# Patient Record
Sex: Male | Born: 1960 | Race: Asian | Hispanic: No | Marital: Married | Smoking: Never smoker
Health system: Southern US, Community
[De-identification: ages and names within clinical notes are randomized; demographics above are authoritative.]

## PROBLEM LIST (undated history)

## (undated) DIAGNOSIS — R079 Chest pain, unspecified: Secondary | ICD-10-CM

## (undated) DIAGNOSIS — Z8249 Family history of ischemic heart disease and other diseases of the circulatory system: Secondary | ICD-10-CM

## (undated) DIAGNOSIS — I10 Essential (primary) hypertension: Secondary | ICD-10-CM

## (undated) DIAGNOSIS — I2510 Atherosclerotic heart disease of native coronary artery without angina pectoris: Secondary | ICD-10-CM

## (undated) HISTORY — DX: Family history of ischemic heart disease and other diseases of the circulatory system: Z82.49

## (undated) HISTORY — DX: Essential (primary) hypertension: I10

## (undated) HISTORY — DX: Chest pain, unspecified: R07.9

---

## 2010-09-12 ENCOUNTER — Emergency Department (HOSPITAL_COMMUNITY): Payer: Self-pay

## 2010-09-12 ENCOUNTER — Encounter (HOSPITAL_COMMUNITY): Payer: Self-pay | Admitting: Radiology

## 2010-09-12 ENCOUNTER — Emergency Department (HOSPITAL_COMMUNITY)
Admission: EM | Admit: 2010-09-12 | Discharge: 2010-09-12 | Disposition: A | Discharge status: Home / Self Care | Payer: Self-pay | Attending: Emergency Medicine | Admitting: Emergency Medicine

## 2010-09-12 DIAGNOSIS — R29898 Other symptoms and signs involving the musculoskeletal system: Secondary | ICD-10-CM | POA: Insufficient documentation

## 2010-09-12 DIAGNOSIS — G459 Transient cerebral ischemic attack, unspecified: Secondary | ICD-10-CM | POA: Insufficient documentation

## 2010-09-12 DIAGNOSIS — I10 Essential (primary) hypertension: Secondary | ICD-10-CM | POA: Insufficient documentation

## 2010-09-12 DIAGNOSIS — R51 Headache: Secondary | ICD-10-CM | POA: Insufficient documentation

## 2010-09-12 DIAGNOSIS — I252 Old myocardial infarction: Secondary | ICD-10-CM | POA: Insufficient documentation

## 2010-09-12 DIAGNOSIS — R209 Unspecified disturbances of skin sensation: Secondary | ICD-10-CM | POA: Insufficient documentation

## 2010-09-12 LAB — COMPREHENSIVE METABOLIC PANEL
AST: 22 U/L (ref 0–37)
Albumin: 4 g/dL (ref 3.5–5.2)
BUN: 12 mg/dL (ref 6–23)
Chloride: 104 mEq/L (ref 96–112)
Creatinine, Ser: 0.87 mg/dL (ref 0.50–1.35)
Potassium: 3.8 mEq/L (ref 3.5–5.1)
Total Protein: 6.9 g/dL (ref 6.0–8.3)

## 2010-09-12 LAB — HEMOGLOBIN A1C: Mean Plasma Glucose: 103 mg/dL (ref <117)

## 2010-09-12 LAB — APTT: aPTT: 29 seconds (ref 24–37)

## 2010-09-12 LAB — CK TOTAL AND CKMB (NOT AT ARMC)
CK, MB: 4.7 ng/mL — ABNORMAL HIGH (ref 0.3–4.0)
Relative Index: 1.6 (ref 0.0–2.5)
Total CK: 285 U/L — ABNORMAL HIGH (ref 7–232)

## 2010-09-12 LAB — CBC
MCH: 31.9 pg (ref 26.0–34.0)
MCV: 91.5 fL (ref 78.0–100.0)
Platelets: 148 10*3/uL — ABNORMAL LOW (ref 150–400)
RBC: 4.95 MIL/uL (ref 4.22–5.81)
RDW: 12.1 % (ref 11.5–15.5)
WBC: 7.5 10*3/uL (ref 4.0–10.5)

## 2010-09-12 LAB — PROTIME-INR
INR: 0.94 (ref 0.00–1.49)
Prothrombin Time: 12.8 seconds (ref 11.6–15.2)

## 2010-09-12 LAB — URINALYSIS, ROUTINE W REFLEX MICROSCOPIC
Glucose, UA: NEGATIVE mg/dL
Hgb urine dipstick: NEGATIVE
Leukocytes, UA: NEGATIVE
pH: 6.5 (ref 5.0–8.0)

## 2012-09-26 ENCOUNTER — Encounter: Payer: Self-pay | Admitting: Cardiovascular Disease

## 2012-09-26 ENCOUNTER — Ambulatory Visit (INDEPENDENT_AMBULATORY_CARE_PROVIDER_SITE_OTHER): Payer: BC Managed Care – PPO | Admitting: Cardiovascular Disease

## 2012-09-26 VITALS — BP 130/98 | HR 65 | Ht 67.25 in | Wt 179.0 lb

## 2012-09-26 DIAGNOSIS — Z8249 Family history of ischemic heart disease and other diseases of the circulatory system: Secondary | ICD-10-CM | POA: Insufficient documentation

## 2012-09-26 DIAGNOSIS — I10 Essential (primary) hypertension: Secondary | ICD-10-CM | POA: Insufficient documentation

## 2012-09-26 DIAGNOSIS — R079 Chest pain, unspecified: Secondary | ICD-10-CM | POA: Insufficient documentation

## 2012-09-26 MED ORDER — LISINOPRIL 5 MG PO TABS: 5.0000 mg | ORAL_TABLET | Freq: Every day | ORAL | Status: DC

## 2012-09-26 NOTE — Assessment & Plan Note (Signed)
Patient has a chest pain for several months. It is not necessarily related to exercise but also occurs after eating. He is on a PPI. This was factors include hypertension and strong family history of heart disease with a father and brother both of died of MIs. Under the get any exercise Myoview stress test to risk stratify him.

## 2012-09-26 NOTE — Patient Instructions (Addendum)
  We will see you back in follow up after the myoview  Dr Allyson Sabal has ordered an exercise myoview. Your physician has requested that you have en exercise stress myoview. For further information please visit [email or web address] Please follow instruction sheet, as given.  Start Lisiniopril 5 mg daily.  The prescription has been sent to the pharmacy.

## 2012-09-26 NOTE — Progress Notes (Signed)
   09/26/2012 Steven Mcneil   1960-12-26  [id number]  Primary Physician Daisy Floro, MD Primary Cardiologist: Runell Gess MD Roseanne Reno   HPI:  Mr. Steven Mcneil is a 52 year old married [place] male([place]) father of one 24-year-old daughter who is referred by Dr. Duane Lope for evaluation of chest pain. His cardiac risk factors profile includes include hypertension as well as family history. He had chest pain for several months that is intermittent. It is not necessary cilia associated with exertion.    Current Outpatient Prescriptions  Medication Sig Dispense Refill  . aspirin 81 MG tablet Take 81 mg by mouth daily.      Marland Kitchen omeprazole (PRILOSEC) 20 MG capsule Take 20 mg by mouth daily.      Marland Kitchen lisinopril (PRINIVIL,ZESTRIL) 5 MG tablet Take 1 tablet (5 mg total) by mouth daily.  30 tablet  6   No current facility-administered medications for this visit.    No Known Allergies  History   Social History  . Marital Status: Married    Spouse Name: N/A    Number of Children: N/A  . Years of Education: N/A   Occupational History  . Not on file.   Social History Main Topics  . Smoking status: Never Smoker   . Smokeless tobacco: Not on file  . Alcohol Use: No  . Drug Use: Not on file  . Sexual Activity: Not on file   Other Topics Concern  . Not on file   Social History Narrative  . No narrative on file     Review of Systems: General: negative for chills, fever, night sweats or weight changes.  Cardiovascular: negative for chest pain, dyspnea on exertion, edema, orthopnea, palpitations, paroxysmal nocturnal dyspnea or shortness of breath Dermatological: negative for rash Respiratory: negative for cough or wheezing Urologic: negative for hematuria Abdominal: negative for nausea, vomiting, diarrhea, bright red blood per rectum, melena, or hematemesis Neurologic: negative for visual changes, syncope, or dizziness All other systems reviewed and are otherwise  negative except as noted above.    Blood pressure 130/98, pulse 65, height 5' 7.25" (1.708 m), weight 179 lb (81.194 kg).  General appearance: alert and no distress Neck: no adenopathy, no carotid bruit, no JVD, supple, symmetrical, trachea midline and thyroid not enlarged, symmetric, no tenderness/mass/nodules Lungs: clear to auscultation bilaterally Heart: regular rate and rhythm, S1, S2 normal, no murmur, click, rub or gallop Abdomen: soft, non-tender; bowel sounds normal; no masses,  no organomegaly Extremities: extremities normal, atraumatic, no cyanosis or edema Pulses: 2+ and symmetric  EKG normal sinus rhythm at 65 without ST or T wave changes  ASSESSMENT AND PLAN:   Chest pain Patient has a chest pain for several months. It is not necessarily related to exercise but also occurs after eating. He is on a PPI. This was factors include hypertension and strong family history of heart disease with a father and brother both of died of MIs. Under the get any exercise Myoview stress test to risk stratify him.  Essential hypertension Poorly controlled currently on no medications. His renal function is normal. I'm going to begin him on a low-dose ACE inhibitor.      Runell Gess MD FACP,FACC,FAHA, [facility] 09/26/2012 12:07 PM

## 2012-09-26 NOTE — Assessment & Plan Note (Signed)
Poorly controlled currently on no medications. His renal function is normal. I'm going to begin him on a low-dose ACE inhibitor.

## 2012-10-02 ENCOUNTER — Ambulatory Visit (HOSPITAL_COMMUNITY)
Admission: RE | Admit: 2012-10-02 | Discharge: 2012-10-02 | Disposition: A | Discharge status: Home / Self Care | Payer: BC Managed Care – PPO | Source: Ambulatory Visit | Attending: Cardiovascular Disease | Admitting: Cardiovascular Disease

## 2012-10-02 DIAGNOSIS — R079 Chest pain, unspecified: Secondary | ICD-10-CM | POA: Insufficient documentation

## 2012-10-02 MED ORDER — TECHNETIUM TC 99M SESTAMIBI GENERIC - CARDIOLITE
30.6000 | Freq: Once | INTRAVENOUS | Status: AC | PRN
  Administered 2012-10-02: 31 via INTRAVENOUS

## 2012-10-02 MED ORDER — TECHNETIUM TC 99M SESTAMIBI GENERIC - CARDIOLITE
10.8000 | Freq: Once | INTRAVENOUS | Status: AC | PRN
  Administered 2012-10-02: 11 via INTRAVENOUS

## 2012-10-02 NOTE — Procedures (Addendum)
[facility] [address] [place] [address] [phone]  Cardiology Nuclear Med Study  Steven Mcneil is a 52 y.o. male     MRN : [id number]     DOB: 08-Aug-1960  Procedure Date: 10/02/2012  Nuclear Med Background Indication for Stress Test:  Evaluation for Ischemia History:  No prior cardiac history Cardiac Risk Factors: Family History - CAD and Hypertension  Symptoms:  Chest Pain   Nuclear Pre-Procedure Caffeine/Decaff Intake:  9:00pm NPO After: 7:00am   IV Site: R Antecubital  IV 0.9% NS with Angio Cath:  22g  Chest Size (in):  42 IV Started by: Koren Shiver, CNMT  Height: 5\' 7"  (1.702 m)  Cup Size: n/a  BMI:  Body mass index is 28.03 kg/(m^2). Weight:  179 lb (81.194 kg)   Tech Comments:  n/a    Nuclear Med Study 1 or 2 day study: 1 day  Stress Test Type:  Stress  Order Authorizing Provider:  Nanetta Batty, MD   Resting Radionuclide: Technetium 11m Sestamibi  Resting Radionuclide Dose: 10.8 mCi   Stress Radionuclide:  Technetium 83m Sestamibi  Stress Radionuclide Dose: 30.6 mCi           Stress Protocol Rest HR: 69 Stress HR: 166  Rest BP: 120/91 Stress BP: 164/87  Exercise Time (min): 10:46 METS: 11.20   Predicted Max HR: 170 bpm % Max HR: 97.65 bpm Rate Pressure Product: 21308  Dose of Adenosine (mg):  n/a Dose of Lexiscan: n/a mg  Dose of Atropine (mg): n/a Dose of Dobutamine: n/a mcg/kg/min (at max HR)  Stress Test Technologist: Ernestene Mention, CCT Nuclear Technologist: Gonzella Lex, CNMT   Rest Procedure:  Myocardial perfusion imaging was performed at rest 45 minutes following the intravenous administration of Technetium 46m Sestamibi. Stress Procedure:  The patient performed treadmill exercise using a Bruce  Protocol for 10 minutes and 46 seconds. The patient stopped due to chest pain, shortness and fatigue. Pain scale 1 to 10 was a 2 or 3. Symptoms did resolved during recovery. There were no  significant ST-T wave changes.  Technetium 10m Sestamibi was injected at peak exercise and myocardial perfusion imaging was performed after a brief delay.  Transient Ischemic Dilatation (Normal <1.22):  0.88 Lung/Heart Ratio (Normal <0.45):  0.37 QGS EDV:  67 ml QGS ESV:  26 ml LV Ejection Fraction: 61%     Rest ECG: NSR - Normal EKG  Stress ECG: No significant change from baseline ECG  QPS Raw Data Images:  Normal; no motion artifact; normal heart/lung ratio. Stress Images:  Normal homogeneous uptake in all areas of the myocardium. Rest Images:  Normal homogeneous uptake in all areas of the myocardium. Subtraction (SDS):  No evidence of ischemia. LV Wall Motion:  NL LV Function; NL Wall Motion  Impression Exercise Capacity:  Good exercise capacity. BP Response:  Normal blood pressure response. Clinical Symptoms:  No significant symptoms noted. ECG Impression:  No significant ST segment change suggestive of ischemia. Comparison with Prior Nuclear Study: No previous nuclear study performed   Overall Impression:  Normal stress nuclear study.   Thurmon Fair, MD  10/02/2012 12:37 PM

## 2012-10-03 ENCOUNTER — Encounter: Payer: Self-pay | Admitting: *Deleted

## 2012-10-17 ENCOUNTER — Encounter: Payer: Self-pay | Admitting: Cardiovascular Disease

## 2012-10-17 ENCOUNTER — Ambulatory Visit (INDEPENDENT_AMBULATORY_CARE_PROVIDER_SITE_OTHER): Payer: BC Managed Care – PPO | Admitting: Cardiovascular Disease

## 2012-10-17 VITALS — BP 110/62 | HR 80 | Ht 67.25 in | Wt 178.0 lb

## 2012-10-17 DIAGNOSIS — E785 Hyperlipidemia, unspecified: Secondary | ICD-10-CM

## 2012-10-17 DIAGNOSIS — Z79899 Other long term (current) drug therapy: Secondary | ICD-10-CM

## 2012-10-17 DIAGNOSIS — I10 Essential (primary) hypertension: Secondary | ICD-10-CM

## 2012-10-17 NOTE — Assessment & Plan Note (Signed)
Well-controlled on current medications 

## 2012-10-17 NOTE — Progress Notes (Signed)
     10/17/2012 Steven Mcneil   [birth year]  [id number]  Primary Physician  Duane Lope, MD Primary Cardiologist: Runell Gess MD Roseanne Reno   HPI:  Steven Mcneil is a 52 year old married [place] male([place]) father of one 44-year-old daughter who is referred by Dr. Duane Lope for evaluation of chest pain. His cardiac risk factors profile includes include hypertension as well as family history. He had chest pain for several months that is intermittent. It is not necessarily  associated with exertion. Since I saw him 2 weeks ago at obtain a Myoview stress test which was entirely normal. He said once minor episode of chest pain since.    Current Outpatient Prescriptions  Medication Sig Dispense Refill  . aspirin 81 MG tablet Take 81 mg by mouth daily.      Marland Kitchen lisinopril (PRINIVIL,ZESTRIL) 5 MG tablet Take 1 tablet (5 mg total) by mouth daily.  30 tablet  6  . omeprazole (PRILOSEC) 20 MG capsule Take 20 mg by mouth daily.       No current facility-administered medications for this visit.    No Known Allergies  History   Social History  . Marital Status: Married    Spouse Name: N/A    Number of Children: N/A  . Years of Education: N/A   Occupational History  . Not on file.   Social History Main Topics  . Smoking status: Never Smoker   . Smokeless tobacco: Not on file  . Alcohol Use: No  . Drug Use: Not on file  . Sexual Activity: Not on file   Other Topics Concern  . Not on file   Social History Narrative  . No narrative on file     Review of Systems: General: negative for chills, fever, night sweats or weight changes.  Cardiovascular: negative for chest pain, dyspnea on exertion, edema, orthopnea, palpitations, paroxysmal nocturnal dyspnea or shortness of breath Dermatological: negative for rash Respiratory: negative for cough or wheezing Urologic: negative for hematuria Abdominal: negative for nausea, vomiting, diarrhea, bright red blood per rectum,  melena, or hematemesis Neurologic: negative for visual changes, syncope, or dizziness All other systems reviewed and are otherwise negative except as noted above.    Blood pressure 110/62, pulse 80, height 5' 7.25" (1.708 m), weight 178 lb (80.74 kg).  General appearance: alert and no distress Neck: no adenopathy, no carotid bruit, no JVD, supple, symmetrical, trachea midline and thyroid not enlarged, symmetric, no tenderness/mass/nodules Lungs: clear to auscultation bilaterally Heart: regular rate and rhythm, S1, S2 normal, no murmur, click, rub or gallop Abdomen: soft, non-tender; bowel sounds normal; no masses,  no organomegaly Pulses: 2+ and symmetric  EKG not performed today  ASSESSMENT AND PLAN:   Chest pain Patient has had a nuclear stress test performed on 10/02/12 which is entirely normal. He said one episode of chest pain since I saw him 2 weeks ago. I have reassured him that the likelihood that this is coming from his heart is small.  Essential hypertension Well-controlled on current medications      Runell Gess MD [facility], [facility] 10/17/2012 4:06 PM

## 2012-10-17 NOTE — Assessment & Plan Note (Signed)
Patient has had a nuclear stress test performed on 10/02/12 which is entirely normal. He said one episode of chest pain since I saw him 2 weeks ago. I have reassured him that the likelihood that this is coming from his heart is small.

## 2012-10-17 NOTE — Patient Instructions (Signed)
Your physician recommends that you schedule a follow-up appointment in: 1 year  Your physician recommends that you return for lab work CMP, LIPIDS you need to be fasting for this test nothing to eat or drink after midnight.

## 2014-10-22 ENCOUNTER — Ambulatory Visit
Admission: RE | Admit: 2014-10-22 | Discharge: 2014-10-22 | Disposition: A | Discharge status: Home / Self Care | Payer: 59 | Source: Ambulatory Visit | Attending: Cardiology | Admitting: Cardiology

## 2014-10-22 ENCOUNTER — Other Ambulatory Visit: Payer: Self-pay | Admitting: Cardiology

## 2014-10-22 DIAGNOSIS — R0789 Other chest pain: Secondary | ICD-10-CM

## 2014-11-02 ENCOUNTER — Other Ambulatory Visit: Payer: Self-pay | Admitting: Cardiology

## 2014-11-02 DIAGNOSIS — R0789 Other chest pain: Secondary | ICD-10-CM

## 2014-11-02 DIAGNOSIS — Z8249 Family history of ischemic heart disease and other diseases of the circulatory system: Secondary | ICD-10-CM

## 2014-11-09 ENCOUNTER — Other Ambulatory Visit: Payer: 59

## 2015-09-13 DIAGNOSIS — M545 Low back pain: Secondary | ICD-10-CM | POA: Diagnosis not present

## 2015-10-05 DIAGNOSIS — Z23 Encounter for immunization: Secondary | ICD-10-CM | POA: Diagnosis not present

## 2015-10-17 DIAGNOSIS — Z125 Encounter for screening for malignant neoplasm of prostate: Secondary | ICD-10-CM | POA: Diagnosis not present

## 2015-10-17 DIAGNOSIS — Z0000 Encounter for general adult medical examination without abnormal findings: Secondary | ICD-10-CM | POA: Diagnosis not present

## 2015-10-17 DIAGNOSIS — Z13220 Encounter for screening for lipoid disorders: Secondary | ICD-10-CM | POA: Diagnosis not present

## 2015-10-17 DIAGNOSIS — Z131 Encounter for screening for diabetes mellitus: Secondary | ICD-10-CM | POA: Diagnosis not present

## 2015-11-01 DIAGNOSIS — R079 Chest pain, unspecified: Secondary | ICD-10-CM | POA: Diagnosis not present

## 2015-11-01 DIAGNOSIS — R7301 Impaired fasting glucose: Secondary | ICD-10-CM | POA: Diagnosis not present

## 2015-12-19 DIAGNOSIS — Z1211 Encounter for screening for malignant neoplasm of colon: Secondary | ICD-10-CM | POA: Diagnosis not present

## 2016-04-26 DIAGNOSIS — H1013 Acute atopic conjunctivitis, bilateral: Secondary | ICD-10-CM | POA: Diagnosis not present

## 2016-04-26 DIAGNOSIS — J3489 Other specified disorders of nose and nasal sinuses: Secondary | ICD-10-CM | POA: Diagnosis not present

## 2016-07-16 DIAGNOSIS — M545 Low back pain: Secondary | ICD-10-CM | POA: Diagnosis not present

## 2016-07-16 DIAGNOSIS — E119 Type 2 diabetes mellitus without complications: Secondary | ICD-10-CM | POA: Diagnosis not present

## 2016-09-24 DIAGNOSIS — M5431 Sciatica, right side: Secondary | ICD-10-CM | POA: Diagnosis not present

## 2016-09-25 ENCOUNTER — Ambulatory Visit
Admission: RE | Admit: 2016-09-25 | Discharge: 2016-09-25 | Disposition: A | Discharge status: Home / Self Care | Payer: BLUE CROSS/BLUE SHIELD | Source: Ambulatory Visit | Attending: Family Medicine | Admitting: Family Medicine

## 2016-09-25 ENCOUNTER — Other Ambulatory Visit: Payer: Self-pay | Admitting: Family Medicine

## 2016-09-25 DIAGNOSIS — R200 Anesthesia of skin: Secondary | ICD-10-CM

## 2016-09-25 DIAGNOSIS — M5136 Other intervertebral disc degeneration, lumbar region: Secondary | ICD-10-CM | POA: Diagnosis not present

## 2016-10-02 DIAGNOSIS — M9903 Segmental and somatic dysfunction of lumbar region: Secondary | ICD-10-CM | POA: Diagnosis not present

## 2016-10-02 DIAGNOSIS — M5136 Other intervertebral disc degeneration, lumbar region: Secondary | ICD-10-CM | POA: Diagnosis not present

## 2016-10-02 DIAGNOSIS — M9905 Segmental and somatic dysfunction of pelvic region: Secondary | ICD-10-CM | POA: Diagnosis not present

## 2016-10-02 DIAGNOSIS — M9902 Segmental and somatic dysfunction of thoracic region: Secondary | ICD-10-CM | POA: Diagnosis not present

## 2016-10-03 DIAGNOSIS — M9905 Segmental and somatic dysfunction of pelvic region: Secondary | ICD-10-CM | POA: Diagnosis not present

## 2016-10-03 DIAGNOSIS — M9902 Segmental and somatic dysfunction of thoracic region: Secondary | ICD-10-CM | POA: Diagnosis not present

## 2016-10-03 DIAGNOSIS — M5136 Other intervertebral disc degeneration, lumbar region: Secondary | ICD-10-CM | POA: Diagnosis not present

## 2016-10-03 DIAGNOSIS — M9903 Segmental and somatic dysfunction of lumbar region: Secondary | ICD-10-CM | POA: Diagnosis not present

## 2016-10-04 DIAGNOSIS — M9903 Segmental and somatic dysfunction of lumbar region: Secondary | ICD-10-CM | POA: Diagnosis not present

## 2016-10-04 DIAGNOSIS — M9905 Segmental and somatic dysfunction of pelvic region: Secondary | ICD-10-CM | POA: Diagnosis not present

## 2016-10-04 DIAGNOSIS — M9902 Segmental and somatic dysfunction of thoracic region: Secondary | ICD-10-CM | POA: Diagnosis not present

## 2016-10-04 DIAGNOSIS — M5136 Other intervertebral disc degeneration, lumbar region: Secondary | ICD-10-CM | POA: Diagnosis not present

## 2016-10-24 DIAGNOSIS — Z125 Encounter for screening for malignant neoplasm of prostate: Secondary | ICD-10-CM | POA: Diagnosis not present

## 2016-10-24 DIAGNOSIS — R351 Nocturia: Secondary | ICD-10-CM | POA: Diagnosis not present

## 2016-10-24 DIAGNOSIS — Z23 Encounter for immunization: Secondary | ICD-10-CM | POA: Diagnosis not present

## 2016-10-24 DIAGNOSIS — Z0000 Encounter for general adult medical examination without abnormal findings: Secondary | ICD-10-CM | POA: Diagnosis not present

## 2016-10-24 DIAGNOSIS — E119 Type 2 diabetes mellitus without complications: Secondary | ICD-10-CM | POA: Diagnosis not present

## 2016-10-24 DIAGNOSIS — Z136 Encounter for screening for cardiovascular disorders: Secondary | ICD-10-CM | POA: Diagnosis not present

## 2017-11-06 DIAGNOSIS — E119 Type 2 diabetes mellitus without complications: Secondary | ICD-10-CM | POA: Diagnosis not present

## 2017-11-06 DIAGNOSIS — Z23 Encounter for immunization: Secondary | ICD-10-CM | POA: Diagnosis not present

## 2017-11-06 DIAGNOSIS — Z1159 Encounter for screening for other viral diseases: Secondary | ICD-10-CM | POA: Diagnosis not present

## 2017-11-06 DIAGNOSIS — Z125 Encounter for screening for malignant neoplasm of prostate: Secondary | ICD-10-CM | POA: Diagnosis not present

## 2017-11-06 DIAGNOSIS — Z0000 Encounter for general adult medical examination without abnormal findings: Secondary | ICD-10-CM | POA: Diagnosis not present

## 2017-11-08 DIAGNOSIS — H47323 Drusen of optic disc, bilateral: Secondary | ICD-10-CM | POA: Diagnosis not present

## 2017-11-08 DIAGNOSIS — E119 Type 2 diabetes mellitus without complications: Secondary | ICD-10-CM | POA: Diagnosis not present

## 2018-07-29 DIAGNOSIS — M5386 Other specified dorsopathies, lumbar region: Secondary | ICD-10-CM | POA: Diagnosis not present

## 2018-07-29 DIAGNOSIS — M9905 Segmental and somatic dysfunction of pelvic region: Secondary | ICD-10-CM | POA: Diagnosis not present

## 2018-07-29 DIAGNOSIS — M9903 Segmental and somatic dysfunction of lumbar region: Secondary | ICD-10-CM | POA: Diagnosis not present

## 2018-07-29 DIAGNOSIS — M9902 Segmental and somatic dysfunction of thoracic region: Secondary | ICD-10-CM | POA: Diagnosis not present

## 2018-08-01 DIAGNOSIS — M9903 Segmental and somatic dysfunction of lumbar region: Secondary | ICD-10-CM | POA: Diagnosis not present

## 2018-08-01 DIAGNOSIS — M5386 Other specified dorsopathies, lumbar region: Secondary | ICD-10-CM | POA: Diagnosis not present

## 2018-08-01 DIAGNOSIS — M9905 Segmental and somatic dysfunction of pelvic region: Secondary | ICD-10-CM | POA: Diagnosis not present

## 2018-08-01 DIAGNOSIS — M9902 Segmental and somatic dysfunction of thoracic region: Secondary | ICD-10-CM | POA: Diagnosis not present

## 2018-08-04 DIAGNOSIS — M9903 Segmental and somatic dysfunction of lumbar region: Secondary | ICD-10-CM | POA: Diagnosis not present

## 2018-08-04 DIAGNOSIS — M5386 Other specified dorsopathies, lumbar region: Secondary | ICD-10-CM | POA: Diagnosis not present

## 2018-08-04 DIAGNOSIS — M9905 Segmental and somatic dysfunction of pelvic region: Secondary | ICD-10-CM | POA: Diagnosis not present

## 2018-08-04 DIAGNOSIS — M9902 Segmental and somatic dysfunction of thoracic region: Secondary | ICD-10-CM | POA: Diagnosis not present

## 2019-04-22 DIAGNOSIS — Z13220 Encounter for screening for lipoid disorders: Secondary | ICD-10-CM | POA: Diagnosis not present

## 2019-04-22 DIAGNOSIS — M722 Plantar fascial fibromatosis: Secondary | ICD-10-CM | POA: Diagnosis not present

## 2019-04-22 DIAGNOSIS — E119 Type 2 diabetes mellitus without complications: Secondary | ICD-10-CM | POA: Diagnosis not present

## 2019-04-22 DIAGNOSIS — Z789 Other specified health status: Secondary | ICD-10-CM | POA: Diagnosis not present

## 2019-04-22 DIAGNOSIS — Z125 Encounter for screening for malignant neoplasm of prostate: Secondary | ICD-10-CM | POA: Diagnosis not present

## 2019-05-20 DIAGNOSIS — Z0000 Encounter for general adult medical examination without abnormal findings: Secondary | ICD-10-CM | POA: Diagnosis not present

## 2019-05-21 ENCOUNTER — Other Ambulatory Visit: Payer: Self-pay

## 2019-05-21 ENCOUNTER — Ambulatory Visit (INDEPENDENT_AMBULATORY_CARE_PROVIDER_SITE_OTHER): Payer: Federal, State, Local not specified - PPO

## 2019-05-21 ENCOUNTER — Ambulatory Visit (INDEPENDENT_AMBULATORY_CARE_PROVIDER_SITE_OTHER): Payer: Federal, State, Local not specified - PPO | Admitting: Podiatry

## 2019-05-21 DIAGNOSIS — M79672 Pain in left foot: Secondary | ICD-10-CM

## 2019-05-21 DIAGNOSIS — M722 Plantar fascial fibromatosis: Secondary | ICD-10-CM

## 2019-05-21 DIAGNOSIS — M79671 Pain in right foot: Secondary | ICD-10-CM

## 2019-05-21 NOTE — Patient Instructions (Signed)

## 2019-05-27 DIAGNOSIS — M5431 Sciatica, right side: Secondary | ICD-10-CM | POA: Diagnosis not present

## 2019-05-27 DIAGNOSIS — M9903 Segmental and somatic dysfunction of lumbar region: Secondary | ICD-10-CM | POA: Diagnosis not present

## 2019-05-27 DIAGNOSIS — M5386 Other specified dorsopathies, lumbar region: Secondary | ICD-10-CM | POA: Diagnosis not present

## 2019-05-27 DIAGNOSIS — M5432 Sciatica, left side: Secondary | ICD-10-CM | POA: Diagnosis not present

## 2019-06-02 ENCOUNTER — Other Ambulatory Visit: Payer: Self-pay | Admitting: Podiatry

## 2019-06-02 DIAGNOSIS — M722 Plantar fascial fibromatosis: Secondary | ICD-10-CM

## 2019-06-18 ENCOUNTER — Ambulatory Visit (INDEPENDENT_AMBULATORY_CARE_PROVIDER_SITE_OTHER): Payer: Federal, State, Local not specified - PPO | Admitting: Podiatry

## 2019-06-18 ENCOUNTER — Other Ambulatory Visit: Payer: Self-pay

## 2019-06-18 DIAGNOSIS — M722 Plantar fascial fibromatosis: Secondary | ICD-10-CM | POA: Diagnosis not present

## 2019-06-18 NOTE — Progress Notes (Signed)
  Subjective:  Patient ID: Steven Mcneil, male    DOB: [birth year],  MRN: [id number]  Chief Complaint  Patient presents with  . Foot Pain    Bilateral plantar heel pain, 3 month duration, no known injuries. Left worse than right. Pt states works long hours on his feet at [employer].  . Numbness    Bilateral plantar midfoot numbness.   59 y.o. male presents with the above complaint. History confirmed with patient.   Objective:  Physical Exam: warm, good capillary refill, no trophic changes or ulcerative lesions, normal DP and PT pulses and normal sensory exam. Left Foot: tenderness to palpation medial calcaneal tuber, no pain with calcaneal squeeze, decreased ankle joint ROM and +Silverskiold test Right Foot: tenderness to palpation medial calcaneal tuber, no pain with calcaneal squeeze, decreased ankle joint ROM and +Silverskiold test normal exam, no swelling, tenderness, instability; ligaments intact, full range of motion of all ankle/foot joints other than findings noted above.  Radiographs: X-ray of both feet: no evidence of calcaneal stress fracture, plantar calcaneal spur, posterior calcaneal spur and Haglund deformity noted  Assessment:   1. Plantar fasciitis   2. Pain of both heels    Plan:  Patient was evaluated and treated and all questions answered.  Plantar Fasciitis -XR reviewed with patient -Educated patient on stretching and icing of the affected limb -Plantar fascial brace dispensed -Injection delivered to the plantar fascia of both feet.  Procedure: Injection Tendon/Ligament Consent: Verbal consent obtained. Location: Bilateral plantar fascia at the glabrous junction; medial approach. Skin Prep: Alcohol. Injectate: 1 cc 0.5% marcaine plain, 1 cc dexamethasone phosphate, 0.5 cc kenalog 10. Disposition: Patient tolerated procedure well. Injection site dressed with a band-aid.  Return in about 3 weeks (around 06/11/2019) for Plantar fasciitis.

## 2019-06-18 NOTE — Progress Notes (Signed)
  Subjective:  Patient ID: Steven Mcneil, male    DOB: [birth year],  MRN: [id number]  Chief Complaint  Patient presents with  . Plantar Fasciitis    Pt states right foot is 2-3/10 and left foot is 8/10 painful. Pt states plantar fascial brace was not effective and caused him numbness/discomfort and he discontinued using it.    59 y.o. male presents with the above complaint. History confirmed with patient.   Objective:  Physical Exam: warm, good capillary refill, no trophic changes or ulcerative lesions, normal DP and PT pulses and normal sensory exam. Left Foot: tenderness to palpation medial calcaneal tuber, no pain with calcaneal squeeze, decreased ankle joint ROM and +Silverskiold test Right Foot: tenderness to palpation medial calcaneal tuber, no pain with calcaneal squeeze, decreased ankle joint ROM and +Silverskiold test normal exam, no swelling, tenderness, instability; ligaments intact, full range of motion of all ankle/foot joints other than findings noted above.   Assessment:   1. Plantar fasciitis    Plan:  Patient was evaluated and treated and all questions answered.  Plantar fasciitis -Repeat injection delivered to the plantar fascia of both feet.  Procedure: Injection Tendon/Ligament Consent: Verbal consent obtained. Location: Bilateral plantar fascia at the glabrous junction; medial approach. Skin Prep: Alcohol. Injectate: 1 cc 0.5% marcaine plain, 1 cc dexamethasone phosphate, 0.5 cc kenalog 10. Disposition: Patient tolerated procedure well. Injection site dressed with a band-aid.  Return in about 3 weeks (around 07/09/2019).

## 2019-07-09 ENCOUNTER — Ambulatory Visit (INDEPENDENT_AMBULATORY_CARE_PROVIDER_SITE_OTHER): Payer: Federal, State, Local not specified - PPO | Admitting: Podiatry

## 2019-07-09 DIAGNOSIS — M722 Plantar fascial fibromatosis: Secondary | ICD-10-CM

## 2019-07-09 DIAGNOSIS — Z5329 Procedure and treatment not carried out because of patient's decision for other reasons: Secondary | ICD-10-CM

## 2019-07-09 NOTE — Progress Notes (Signed)
No show for appt. 

## 2019-08-20 DIAGNOSIS — Z03818 Encounter for observation for suspected exposure to other biological agents ruled out: Secondary | ICD-10-CM | POA: Diagnosis not present

## 2019-08-20 DIAGNOSIS — R21 Rash and other nonspecific skin eruption: Secondary | ICD-10-CM | POA: Diagnosis not present

## 2019-10-10 ENCOUNTER — Inpatient Hospital Stay (HOSPITAL_COMMUNITY)
Admission: EM | Admit: 2019-10-10 | Discharge: 2019-10-16 | DRG: 247 | Disposition: A | Discharge status: Home / Self Care | Payer: BLUE CROSS/BLUE SHIELD | Attending: Cardiology | Admitting: Cardiology

## 2019-10-10 ENCOUNTER — Encounter (HOSPITAL_COMMUNITY)
Admission: EM | Disposition: A | Discharge status: Home / Self Care | Payer: Self-pay | Source: Home / Self Care | Attending: Cardiology

## 2019-10-10 DIAGNOSIS — E119 Type 2 diabetes mellitus without complications: Secondary | ICD-10-CM | POA: Diagnosis not present

## 2019-10-10 DIAGNOSIS — I959 Hypotension, unspecified: Secondary | ICD-10-CM | POA: Diagnosis not present

## 2019-10-10 DIAGNOSIS — E876 Hypokalemia: Secondary | ICD-10-CM | POA: Diagnosis not present

## 2019-10-10 DIAGNOSIS — Z20822 Contact with and (suspected) exposure to covid-19: Secondary | ICD-10-CM | POA: Diagnosis present

## 2019-10-10 DIAGNOSIS — I213 ST elevation (STEMI) myocardial infarction of unspecified site: Secondary | ICD-10-CM

## 2019-10-10 DIAGNOSIS — Z8249 Family history of ischemic heart disease and other diseases of the circulatory system: Secondary | ICD-10-CM

## 2019-10-10 DIAGNOSIS — I2111 ST elevation (STEMI) myocardial infarction involving right coronary artery: Secondary | ICD-10-CM | POA: Diagnosis not present

## 2019-10-10 DIAGNOSIS — Z955 Presence of coronary angioplasty implant and graft: Secondary | ICD-10-CM

## 2019-10-10 DIAGNOSIS — Z79899 Other long term (current) drug therapy: Secondary | ICD-10-CM

## 2019-10-10 DIAGNOSIS — Z23 Encounter for immunization: Secondary | ICD-10-CM

## 2019-10-10 DIAGNOSIS — I2510 Atherosclerotic heart disease of native coronary artery without angina pectoris: Secondary | ICD-10-CM | POA: Diagnosis not present

## 2019-10-10 DIAGNOSIS — R29898 Other symptoms and signs involving the musculoskeletal system: Secondary | ICD-10-CM | POA: Diagnosis not present

## 2019-10-10 DIAGNOSIS — I2582 Chronic total occlusion of coronary artery: Secondary | ICD-10-CM | POA: Diagnosis not present

## 2019-10-10 DIAGNOSIS — Z7982 Long term (current) use of aspirin: Secondary | ICD-10-CM | POA: Diagnosis not present

## 2019-10-10 DIAGNOSIS — R42 Dizziness and giddiness: Secondary | ICD-10-CM | POA: Diagnosis not present

## 2019-10-10 DIAGNOSIS — I25110 Atherosclerotic heart disease of native coronary artery with unstable angina pectoris: Secondary | ICD-10-CM | POA: Diagnosis not present

## 2019-10-10 DIAGNOSIS — R0789 Other chest pain: Secondary | ICD-10-CM | POA: Diagnosis not present

## 2019-10-10 DIAGNOSIS — E785 Hyperlipidemia, unspecified: Secondary | ICD-10-CM | POA: Diagnosis not present

## 2019-10-10 DIAGNOSIS — I10 Essential (primary) hypertension: Secondary | ICD-10-CM | POA: Diagnosis present

## 2019-10-10 DIAGNOSIS — Z7984 Long term (current) use of oral hypoglycemic drugs: Secondary | ICD-10-CM

## 2019-10-10 DIAGNOSIS — R079 Chest pain, unspecified: Secondary | ICD-10-CM | POA: Diagnosis not present

## 2019-10-10 HISTORY — PX: LEFT HEART CATH AND CORONARY ANGIOGRAPHY: CATH118249

## 2019-10-10 HISTORY — PX: CORONARY STENT INTERVENTION: CATH118234

## 2019-10-10 HISTORY — DX: Atherosclerotic heart disease of native coronary artery without angina pectoris: I25.10

## 2019-10-10 HISTORY — PX: CORONARY/GRAFT ACUTE MI REVASCULARIZATION: CATH118305

## 2019-10-10 LAB — CBC WITH DIFFERENTIAL/PLATELET
Abs Immature Granulocytes: 0.06 10*3/uL (ref 0.00–0.07)
Basophils Absolute: 0 10*3/uL (ref 0.0–0.1)
Basophils Relative: 0 %
Eosinophils Absolute: 0 10*3/uL (ref 0.0–0.5)
Eosinophils Relative: 0 %
HCT: 42.6 % (ref 39.0–52.0)
Hemoglobin: 14.8 g/dL (ref 13.0–17.0)
Immature Granulocytes: 1 %
Lymphocytes Relative: 22 %
Lymphs Abs: 2.7 10*3/uL (ref 0.7–4.0)
MCH: 32.1 pg (ref 26.0–34.0)
MCHC: 34.7 g/dL (ref 30.0–36.0)
MCV: 92.4 fL (ref 80.0–100.0)
Monocytes Absolute: 0.6 10*3/uL (ref 0.1–1.0)
Monocytes Relative: 5 %
Neutro Abs: 8.8 10*3/uL — ABNORMAL HIGH (ref 1.7–7.7)
Neutrophils Relative %: 72 %
Platelets: 142 10*3/uL — ABNORMAL LOW (ref 150–400)
RBC: 4.61 MIL/uL (ref 4.22–5.81)
RDW: 11.7 % (ref 11.5–15.5)
WBC: 12.2 10*3/uL — ABNORMAL HIGH (ref 4.0–10.5)
nRBC: 0 % (ref 0.0–0.2)

## 2019-10-10 LAB — RESPIRATORY PANEL BY RT PCR (FLU A&B, COVID)
Influenza A by PCR: NEGATIVE
Influenza B by PCR: NEGATIVE
SARS Coronavirus 2 by RT PCR: NEGATIVE

## 2019-10-10 LAB — COMPREHENSIVE METABOLIC PANEL
ALT: 55 U/L — ABNORMAL HIGH (ref 0–44)
AST: 283 U/L — ABNORMAL HIGH (ref 15–41)
Albumin: 3.6 g/dL (ref 3.5–5.0)
Alkaline Phosphatase: 54 U/L (ref 38–126)
Anion gap: 12 (ref 5–15)
BUN: 11 mg/dL (ref 6–20)
CO2: 21 mmol/L — ABNORMAL LOW (ref 22–32)
Calcium: 8.7 mg/dL — ABNORMAL LOW (ref 8.9–10.3)
Chloride: 104 mmol/L (ref 98–111)
Creatinine, Ser: 0.93 mg/dL (ref 0.61–1.24)
GFR, Estimated: >60 mL/min (ref >60)
Glucose, Bld: 180 mg/dL — ABNORMAL HIGH (ref 70–99)
Potassium: 3.7 mmol/L (ref 3.5–5.1)
Sodium: 137 mmol/L (ref 135–145)
Total Bilirubin: 0.8 mg/dL (ref 0.3–1.2)
Total Protein: 6.1 g/dL — ABNORMAL LOW (ref 6.5–8.1)

## 2019-10-10 LAB — HEMOGLOBIN A1C
Hgb A1c MFr Bld: 7.4 % — ABNORMAL HIGH (ref 4.8–5.6)
Mean Plasma Glucose: 165.68 mg/dL

## 2019-10-10 LAB — TROPONIN I (HIGH SENSITIVITY)
Troponin I (High Sensitivity): >27000 ng/L (ref <18)
Troponin I (High Sensitivity): >27000 ng/L (ref <18)

## 2019-10-10 LAB — MAGNESIUM: Magnesium: 1.9 mg/dL (ref 1.7–2.4)

## 2019-10-10 LAB — HIV ANTIBODY (ROUTINE TESTING W REFLEX): HIV Screen 4th Generation wRfx: NONREACTIVE

## 2019-10-10 LAB — MRSA PCR SCREENING: MRSA by PCR: NEGATIVE

## 2019-10-10 LAB — TSH: TSH: 1.221 u[IU]/mL (ref 0.350–4.500)

## 2019-10-10 LAB — GLUCOSE, CAPILLARY
Glucose-Capillary: 165 mg/dL — ABNORMAL HIGH (ref 70–99)
Glucose-Capillary: 183 mg/dL — ABNORMAL HIGH (ref 70–99)

## 2019-10-10 SURGERY — CORONARY/GRAFT ACUTE MI REVASCULARIZATION
Anesthesia: LOCAL

## 2019-10-10 MED ORDER — VERAPAMIL HCL 2.5 MG/ML IV SOLN
INTRAVENOUS | Status: AC
  Filled 2019-10-10: qty 2

## 2019-10-10 MED ORDER — MELATONIN 3 MG PO TABS
3.0000 mg | ORAL_TABLET | Freq: Every day | ORAL | Status: DC
  Administered 2019-10-10 – 2019-10-15 (×6): 3 mg via ORAL
  Filled 2019-10-10 (×6): qty 1

## 2019-10-10 MED ORDER — HEPARIN SODIUM (PORCINE) 1000 UNIT/ML IJ SOLN
INTRAMUSCULAR | Status: AC
  Filled 2019-10-10: qty 1

## 2019-10-10 MED ORDER — SODIUM CHLORIDE 0.9% FLUSH: 3.0000 mL | INTRAVENOUS | Status: DC | PRN

## 2019-10-10 MED ORDER — SODIUM CHLORIDE 0.9 % IV SOLN: 250.0000 mL | INTRAVENOUS | Status: DC | PRN

## 2019-10-10 MED ORDER — HEPARIN SODIUM (PORCINE) 1000 UNIT/ML IJ SOLN
INTRAMUSCULAR | Status: DC | PRN
  Administered 2019-10-10: 10000 [IU] via INTRAVENOUS
  Administered 2019-10-10: 3000 [IU] via INTRAVENOUS

## 2019-10-10 MED ORDER — ENOXAPARIN SODIUM 40 MG/0.4ML ~~LOC~~ SOLN
40.0000 mg | SUBCUTANEOUS | Status: DC
  Administered 2019-10-11: 40 mg via SUBCUTANEOUS
  Filled 2019-10-10: qty 0.4

## 2019-10-10 MED ORDER — INSULIN ASPART 100 UNIT/ML ~~LOC~~ SOLN
0.0000 [IU] | Freq: Three times a day (TID) | SUBCUTANEOUS | Status: DC
  Administered 2019-10-10 – 2019-10-11 (×2): 3 [IU] via SUBCUTANEOUS
  Administered 2019-10-11: 2 [IU] via SUBCUTANEOUS
  Administered 2019-10-11 – 2019-10-12 (×2): 3 [IU] via SUBCUTANEOUS
  Administered 2019-10-13 (×3): 2 [IU] via SUBCUTANEOUS
  Administered 2019-10-14: 1 [IU] via SUBCUTANEOUS
  Administered 2019-10-14 – 2019-10-15 (×2): 3 [IU] via SUBCUTANEOUS
  Administered 2019-10-16: 2 [IU] via SUBCUTANEOUS

## 2019-10-10 MED ORDER — NITROGLYCERIN 1 MG/10 ML FOR IR/CATH LAB
INTRA_ARTERIAL | Status: DC | PRN
  Administered 2019-10-10 (×3): 200 ug via INTRACORONARY

## 2019-10-10 MED ORDER — TICAGRELOR 90 MG PO TABS
90.0000 mg | ORAL_TABLET | Freq: Two times a day (BID) | ORAL | Status: DC
  Administered 2019-10-10 – 2019-10-12 (×4): 90 mg via ORAL
  Filled 2019-10-10 (×4): qty 1

## 2019-10-10 MED ORDER — TICAGRELOR 90 MG PO TABS
ORAL_TABLET | ORAL | Status: DC | PRN
  Administered 2019-10-10: 180 mg via ORAL

## 2019-10-10 MED ORDER — FENTANYL CITRATE (PF) 100 MCG/2ML IJ SOLN
INTRAMUSCULAR | Status: AC
  Filled 2019-10-10: qty 2

## 2019-10-10 MED ORDER — SODIUM CHLORIDE 0.9 % IV SOLN
INTRAVENOUS | Status: AC | PRN
  Administered 2019-10-10: 75 mL/h via INTRAVENOUS

## 2019-10-10 MED ORDER — NITROGLYCERIN IN D5W 200-5 MCG/ML-% IV SOLN
INTRAVENOUS | Status: AC | PRN
  Administered 2019-10-10: 10 ug/min via INTRAVENOUS

## 2019-10-10 MED ORDER — NITROGLYCERIN IN D5W 200-5 MCG/ML-% IV SOLN: 0.0000 ug/min | INTRAVENOUS | Status: DC

## 2019-10-10 MED ORDER — LIDOCAINE HCL (PF) 1 % IJ SOLN
INTRAMUSCULAR | Status: DC | PRN
  Administered 2019-10-10: 2 mL

## 2019-10-10 MED ORDER — ATORVASTATIN CALCIUM 80 MG PO TABS
80.0000 mg | ORAL_TABLET | Freq: Every day | ORAL | Status: DC
  Administered 2019-10-11 – 2019-10-16 (×6): 80 mg via ORAL
  Filled 2019-10-10 (×6): qty 1

## 2019-10-10 MED ORDER — MIDAZOLAM HCL 2 MG/2ML IJ SOLN
INTRAMUSCULAR | Status: DC | PRN
  Administered 2019-10-10: 1 mg via INTRAVENOUS

## 2019-10-10 MED ORDER — ACETAMINOPHEN 325 MG PO TABS
650.0000 mg | ORAL_TABLET | ORAL | Status: DC | PRN
  Administered 2019-10-11: 650 mg via ORAL
  Filled 2019-10-10: qty 2

## 2019-10-10 MED ORDER — VERAPAMIL HCL 2.5 MG/ML IV SOLN
INTRAVENOUS | Status: DC | PRN
  Administered 2019-10-10: 10 mL via INTRA_ARTERIAL

## 2019-10-10 MED ORDER — TICAGRELOR 90 MG PO TABS
ORAL_TABLET | ORAL | Status: AC
  Filled 2019-10-10: qty 1

## 2019-10-10 MED ORDER — ENOXAPARIN SODIUM 40 MG/0.4ML ~~LOC~~ SOLN: 40.0000 mg | SUBCUTANEOUS | Status: DC

## 2019-10-10 MED ORDER — LIDOCAINE HCL (PF) 1 % IJ SOLN
INTRAMUSCULAR | Status: AC
  Filled 2019-10-10: qty 30

## 2019-10-10 MED ORDER — ASPIRIN 81 MG PO CHEW
81.0000 mg | CHEWABLE_TABLET | Freq: Every day | ORAL | Status: DC
  Administered 2019-10-11 – 2019-10-16 (×6): 81 mg via ORAL
  Filled 2019-10-10 (×6): qty 1

## 2019-10-10 MED ORDER — FENTANYL CITRATE (PF) 100 MCG/2ML IJ SOLN
INTRAMUSCULAR | Status: DC | PRN
Start: 2019-10-10 — End: 2019-10-10
  Administered 2019-10-10: 25 ug via INTRAVENOUS

## 2019-10-10 MED ORDER — NITROGLYCERIN 0.4 MG SL SUBL: 0.4000 mg | SUBLINGUAL_TABLET | SUBLINGUAL | Status: DC | PRN

## 2019-10-10 MED ORDER — IOHEXOL 350 MG/ML SOLN
INTRAVENOUS | Status: DC | PRN
  Administered 2019-10-10: 170 mL

## 2019-10-10 MED ORDER — VERAPAMIL HCL 2.5 MG/ML IV SOLN
INTRAVENOUS | Status: DC | PRN
  Administered 2019-10-10 (×3): 200 ug via INTRACORONARY

## 2019-10-10 MED ORDER — MIDAZOLAM HCL 2 MG/2ML IJ SOLN
INTRAMUSCULAR | Status: AC
  Filled 2019-10-10: qty 2

## 2019-10-10 MED ORDER — HEPARIN (PORCINE) IN NACL 1000-0.9 UT/500ML-% IV SOLN
INTRAVENOUS | Status: DC | PRN
  Administered 2019-10-10 (×2): 500 mL

## 2019-10-10 MED ORDER — SODIUM CHLORIDE 0.9% FLUSH
3.0000 mL | Freq: Two times a day (BID) | INTRAVENOUS | Status: DC
  Administered 2019-10-11 – 2019-10-13 (×4): 3 mL via INTRAVENOUS

## 2019-10-10 MED ORDER — HEPARIN (PORCINE) IN NACL 1000-0.9 UT/500ML-% IV SOLN
INTRAVENOUS | Status: AC
  Filled 2019-10-10: qty 1000

## 2019-10-10 MED ORDER — ONDANSETRON HCL 4 MG/2ML IJ SOLN: 4.0000 mg | Freq: Four times a day (QID) | INTRAMUSCULAR | Status: DC | PRN

## 2019-10-10 MED ORDER — PANTOPRAZOLE SODIUM 40 MG PO TBEC
40.0000 mg | DELAYED_RELEASE_TABLET | Freq: Every day | ORAL | Status: DC
  Administered 2019-10-11 – 2019-10-16 (×6): 40 mg via ORAL
  Filled 2019-10-10 (×6): qty 1

## 2019-10-10 MED ORDER — NITROGLYCERIN IN D5W 200-5 MCG/ML-% IV SOLN
INTRAVENOUS | Status: AC
  Filled 2019-10-10: qty 250

## 2019-10-10 MED ORDER — IOHEXOL 350 MG/ML SOLN
INTRAVENOUS | Status: AC
  Filled 2019-10-10: qty 1

## 2019-10-10 MED ORDER — SODIUM CHLORIDE 0.9 % WEIGHT BASED INFUSION: 1.0000 mL/kg/h | INTRAVENOUS | Status: AC

## 2019-10-10 SURGICAL SUPPLY — 19 items
BALLN SAPPHIRE 2.0X12 (BALLOONS) ×2
BALLN SAPPHIRE ~~LOC~~ 2.75X18 (BALLOONS) ×2 IMPLANT
BALLN SAPPHIRE ~~LOC~~ 3.5X18 (BALLOONS) ×2 IMPLANT
BALLOON SAPPHIRE 2.0X12 (BALLOONS) ×1 IMPLANT
CATH 5FR JL3.5 JR4 ANG PIG MP (CATHETERS) ×2 IMPLANT
CATH VISTA GUIDE 6FR JR4 (CATHETERS) ×2 IMPLANT
DEVICE RAD COMP TR BAND LRG (VASCULAR PRODUCTS) ×2 IMPLANT
ELECT DEFIB PAD ADLT CADENCE (PAD) ×2 IMPLANT
GLIDESHEATH SLEND SS 6F .021 (SHEATH) ×2 IMPLANT
GUIDEWIRE INQWIRE 1.5J.035X260 (WIRE) ×1 IMPLANT
INQWIRE 1.5J .035X260CM (WIRE) ×2
KIT ENCORE 26 ADVANTAGE (KITS) ×2 IMPLANT
KIT HEART LEFT (KITS) ×2 IMPLANT
PACK CARDIAC CATHETERIZATION (CUSTOM PROCEDURE TRAY) ×2 IMPLANT
STENT RESOLUTE ONYX 2.5X30 (Permanent Stent) ×2 IMPLANT
SYR MEDRAD MARK 7 150ML (SYRINGE) ×2 IMPLANT
TRANSDUCER W/STOPCOCK (MISCELLANEOUS) ×2 IMPLANT
TUBING CIL FLEX 10 FLL-RA (TUBING) ×2 IMPLANT
WIRE ASAHI PROWATER 180CM (WIRE) ×4 IMPLANT

## 2019-10-10 NOTE — H&P (Signed)
Cardiology Admission History and Physical:   Patient ID: Steven Mcneil MRN: [id number]; DOB: 27-Feb-1960   Admission date: 10/10/2019  Primary Care Provider: Daisy Floro, MD [facility] Cardiologist: No primary care provider on file.  [facility] Electrophysiologist:  None   Chief Complaint:  Chest pain  Patient Profile:   Steven Mcneil is a 59 y.o. male with acute inferior STEMI  History of Present Illness:   Steven Mcneil is a 59 yo male with history of DM type 2, HTN, ? HLD. He was at work today in [employer] office when he developed acute substernal chest pain. Co workers gave him 2 [employer] and sent him home. When he got home his wife called 911. Ecg showed marked ST elevation inferiorly and Code STEMI was activated. No prior cardiac history. Had remote Myoview in 2014 that was normal. He does have a family history of CAD with father dying of MI and stroke and younger brother dying with MI. Wife reports some type of surgery for stomach ulcers when he was younger.    Past Medical History:  Diagnosis Date   Chest pain    Family history of heart disease    Hypertension     No past surgical history on file.   Medications Prior to Admission: Prior to Admission medications   Medication Sig Start Date End Date Taking? Authorizing Provider  aspirin 81 MG tablet Take 81 mg by mouth daily.    [provider]  lisinopril (PRINIVIL,ZESTRIL) 5 MG tablet Take 1 tablet (5 mg total) by mouth daily. 09/26/12   Runell Gess, MD  metFORMIN (GLUCOPHAGE-XR) 500 MG 24 hr tablet SMARTSIG:1 Tablet(s) By Mouth Every Evening 04/22/19   [provider]  omeprazole (PRILOSEC) 20 MG capsule Take 20 mg by mouth daily.    [provider]     Allergies:   No Known Allergies  Social History:   Social History   Socioeconomic History   Marital status: Married    Spouse name: Not on file   Number of children: Not on file   Years of education: Not on file    Highest education level: Not on file  Occupational History   Not on file  Tobacco Use   Smoking status: Never Smoker  Substance and Sexual Activity   Alcohol use: No   Drug use: Not on file   Sexual activity: Not on file  Other Topics Concern   Not on file  Social History Narrative   Not on file   Social Determinants of Health   Financial Resource Strain:    Difficulty of Paying Living Expenses: Not on file  Food Insecurity:    Worried About Running Out of Food in the Last Year: Not on file   Ran Out of Food in the Last Year: Not on file  Transportation Needs:    Lack of Transportation (Medical): Not on file   Lack of Transportation (Non-Medical): Not on file  Physical Activity:    Days of Exercise per Week: Not on file   Minutes of Exercise per Session: Not on file  Stress:    Feeling of Stress : Not on file  Social Connections:    Frequency of Communication with Friends and Family: Not on file   Frequency of Social Gatherings with Friends and Family: Not on file   Attends Religious Services: Not on file   Active Member of Clubs or Organizations: Not on file   Attends Banker Meetings: Not on  file   Marital Status: Not on file  Intimate Partner Violence:    Fear of Current or Ex-Partner: Not on file   Emotionally Abused: Not on file   Physically Abused: Not on file   Sexually Abused: Not on file    Family History:  As noted in HPT. The patient's family history includes Heart attack in his brother and father.    ROS:  Please see the history of present illness.  All other ROS reviewed and negative.     Physical Exam/Data:   Vitals:   10/10/19 1239  SpO2: 99%   No intake or output data in the 24 hours ending 10/10/19 1409 Last 3 Weights 10/17/2012 10/02/2012 09/26/2012  Weight (lbs) 178 lb 179 lb 179 lb  Weight (kg) 80.74 kg 81.194 kg 81.194 kg     There is no height or weight on file to calculate BMI.  General:  Well  nourished, well developed, in  acute distress HEENT: normal Lymph: no adenopathy Neck: no JVD Endocrine:  No thryomegaly Vascular: No carotid bruits; FA pulses 2+ bilaterally without bruits  Cardiac:  normal S1, S2; RRR; no murmur  Lungs:  clear to auscultation bilaterally, no wheezing, rhonchi or rales  Abd: soft, nontender, no hepatomegaly  Ext: no edema Musculoskeletal:  No deformities, BUE and BLE strength normal and equal Skin: warm and dry  Neuro:  CNs 2-12 intact, no focal abnormalities noted Psych:  Normal affect    EKG:  The ECG that was done today was personally reviewed and demonstrates NSR with acute 4 mm ST elevation inferiorly with reciprocal ST depression in anterior leads.   Relevant CV Studies: Myoview 2014 normal.  Laboratory Data:  High Sensitivity Troponin:  No results for input(s): TROPONINIHS in the last 720 hours.    ChemistryNo results for input(s): NA, K, CL, CO2, GLUCOSE, BUN, CREATININE, CALCIUM, GFRNONAA, GFRAA, ANIONGAP in the last 168 hours.  No results for input(s): PROT, ALBUMIN, AST, ALT, ALKPHOS, BILITOT in the last 168 hours. HematologyNo results for input(s): WBC, RBC, HGB, HCT, MCV, MCH, MCHC, RDW, PLT in the last 168 hours. BNPNo results for input(s): BNP, PROBNP in the last 168 hours.  DDimer No results for input(s): DDIMER in the last 168 hours.   Radiology/Studies:  CARDIAC CATHETERIZATION  Result Date: 10/10/2019  RV Branch lesion is 75% stenosed.  Dist RCA lesion is 100% stenosed.  1st Mrg lesion is 30% stenosed.  Prox LAD lesion is 85% stenosed.  Mid LAD lesion is 50% stenosed.  1st Diag lesion is 95% stenosed.  RPAV lesion is 95% stenosed.  Post intervention, there is a 0% residual stenosis.  A drug-eluting stent was successfully placed using a STENT RESOLUTE ONYX 2.5X30.  Post intervention, there is a 0% residual stenosis.  A stent was successfully placed.  There is mild left ventricular systolic dysfunction.  LV end  diastolic pressure is mildly elevated.  The left ventricular ejection fraction is 45-50% by visual estimate.  1. Severe 2 vessel obstructive CAD.    - 100% distal RCA occlusion- this is the culprit    - 85% proximal LAD bifurcation stenosis involving the first diagonal which is 95% stenosis. Medina class 1,1,1. 2. Mild LV dysfunction 3. Mildly elevated LVEDP 20 mm Hg 4. Successful PCI of the distal RCA extending into a large PL branch with DES x 1. Plan: DAPT for one year. Anticipate return in 2 days for complex bifurcation PCI of the LAD/diagonal. Risk factor modification.     Assessment  and Plan:   1. Acute inferior STEMI. Recommend emergent cardiac cath/ PCI. Given ASA already. Will heparinize in the cath lab.  2. Diabetes mellitus type 2 on metformin. Will hold metformin and cover with SSI. 3. HTN 4. Lipid status is unknown.        TIMI Risk Score for ST  Elevation MI:   The patient's TIMI risk score is 1, which indicates a 1.6% risk of all cause mortality at 30 days.       Severity of Illness: The appropriate patient status for this patient is INPATIENT. Inpatient status is judged to be reasonable and necessary in order to provide the required intensity of service to ensure the patient's safety. The patient's presenting symptoms, physical exam findings, and initial radiographic and laboratory data in the context of their chronic comorbidities is felt to place them at high risk for further clinical deterioration. Furthermore, it is not anticipated that the patient will be medically stable for discharge from the hospital within 2 midnights of admission. The following factors support the patient status of inpatient.   " The patient's presenting symptoms include chest pain. " The worrisome physical exam findings include none. " The initial radiographic and laboratory data are worrisome because of marked ST elevation inferiorly. " The chronic co-morbidities include DM, HTN.   * I  certify that at the point of admission it is my clinical judgment that the patient will require inpatient hospital care spanning beyond 2 midnights from the point of admission due to high intensity of service, high risk for further deterioration and high frequency of surveillance required.*    For questions or updates, please contact [facility] Please consult [email or web address] for contact info under     Signed, Kayelynn Abdou [place], MD  10/10/2019 2:09 PM

## 2019-10-10 NOTE — Progress Notes (Signed)
   10/10/19 1200  Clinical Encounter Type  Visited With Patient;Health care provider  Visit Type Initial  Referral From Nurse  Consult/Referral To Chaplain  The patient went directly to the Cath Lab. The Chaplain spoke with EMT and doctor but there is no family present. The chaplain will follow up as needed.

## 2019-10-10 NOTE — Progress Notes (Signed)
1455 Put in order for CRP 2 [place]. Will follow up Monday since pt just had PCI and for staged intervention. Luetta Nutting RN BSN 10/10/2019 2:56 PM

## 2019-10-11 ENCOUNTER — Inpatient Hospital Stay (HOSPITAL_COMMUNITY): Payer: BLUE CROSS/BLUE SHIELD

## 2019-10-11 DIAGNOSIS — I2111 ST elevation (STEMI) myocardial infarction involving right coronary artery: Secondary | ICD-10-CM | POA: Diagnosis not present

## 2019-10-11 DIAGNOSIS — R079 Chest pain, unspecified: Secondary | ICD-10-CM

## 2019-10-11 LAB — LIPID PANEL
Cholesterol: 167 mg/dL (ref 0–200)
HDL: 38 mg/dL — ABNORMAL LOW (ref >40)
LDL Cholesterol: 89 mg/dL (ref 0–99)
Total CHOL/HDL Ratio: 4.4 RATIO
Triglycerides: 198 mg/dL — ABNORMAL HIGH (ref <150)
VLDL: 40 mg/dL (ref 0–40)

## 2019-10-11 LAB — ECHOCARDIOGRAM COMPLETE
Height: 67 in
S' Lateral: 3.3 cm
Weight: 2828.94 oz

## 2019-10-11 LAB — GLUCOSE, CAPILLARY
Glucose-Capillary: 165 mg/dL — ABNORMAL HIGH (ref 70–99)
Glucose-Capillary: 150 mg/dL — ABNORMAL HIGH (ref 70–99)
Glucose-Capillary: 164 mg/dL — ABNORMAL HIGH (ref 70–99)
Glucose-Capillary: 133 mg/dL — ABNORMAL HIGH (ref 70–99)

## 2019-10-11 LAB — BASIC METABOLIC PANEL
Anion gap: 11 (ref 5–15)
BUN: 9 mg/dL (ref 6–20)
CO2: 21 mmol/L — ABNORMAL LOW (ref 22–32)
Calcium: 8.6 mg/dL — ABNORMAL LOW (ref 8.9–10.3)
Chloride: 102 mmol/L (ref 98–111)
Creatinine, Ser: 0.72 mg/dL (ref 0.61–1.24)
GFR, Estimated: >60 mL/min (ref >60)
Glucose, Bld: 167 mg/dL — ABNORMAL HIGH (ref 70–99)
Potassium: 3.4 mmol/L — ABNORMAL LOW (ref 3.5–5.1)
Sodium: 134 mmol/L — ABNORMAL LOW (ref 135–145)

## 2019-10-11 LAB — CBC
HCT: 40.7 % (ref 39.0–52.0)
Hemoglobin: 14 g/dL (ref 13.0–17.0)
MCH: 32 pg (ref 26.0–34.0)
MCHC: 34.4 g/dL (ref 30.0–36.0)
MCV: 93.1 fL (ref 80.0–100.0)
Platelets: 145 10*3/uL — ABNORMAL LOW (ref 150–400)
RBC: 4.37 MIL/uL (ref 4.22–5.81)
RDW: 11.9 % (ref 11.5–15.5)
WBC: 15.6 10*3/uL — ABNORMAL HIGH (ref 4.0–10.5)
nRBC: 0 % (ref 0.0–0.2)

## 2019-10-11 LAB — HEPARIN LEVEL (UNFRACTIONATED): Heparin Unfractionated: 0.41 IU/mL (ref 0.30–0.70)

## 2019-10-11 MED ORDER — POTASSIUM CHLORIDE CRYS ER 20 MEQ PO TBCR
40.0000 meq | EXTENDED_RELEASE_TABLET | Freq: Once | ORAL | Status: AC
  Administered 2019-10-11: 40 meq via ORAL
  Filled 2019-10-11: qty 2

## 2019-10-11 MED ORDER — HEPARIN (PORCINE) 25000 UT/250ML-% IV SOLN
950.0000 [IU]/h | INTRAVENOUS | Status: DC
  Administered 2019-10-11 – 2019-10-12 (×2): 950 [IU]/h via INTRAVENOUS
  Filled 2019-10-11 (×2): qty 250

## 2019-10-11 MED ORDER — SODIUM CHLORIDE 0.9 % IV SOLN: 250.0000 mL | INTRAVENOUS | Status: DC | PRN

## 2019-10-11 MED ORDER — SODIUM CHLORIDE 0.9% FLUSH: 3.0000 mL | INTRAVENOUS | Status: DC | PRN

## 2019-10-11 MED ORDER — SODIUM CHLORIDE 0.9 % WEIGHT BASED INFUSION: 3.0000 mL/kg/h | INTRAVENOUS | Status: DC

## 2019-10-11 MED ORDER — CHLORHEXIDINE GLUCONATE CLOTH 2 % EX PADS
6.0000 | MEDICATED_PAD | Freq: Every day | CUTANEOUS | Status: DC
  Administered 2019-10-14: 6 via TOPICAL

## 2019-10-11 MED ORDER — SODIUM CHLORIDE 0.9 % WEIGHT BASED INFUSION: 1.0000 mL/kg/h | INTRAVENOUS | Status: DC

## 2019-10-11 MED ORDER — INFLUENZA VAC SPLIT QUAD 0.5 ML IM SUSY
0.5000 mL | PREFILLED_SYRINGE | INTRAMUSCULAR | Status: AC
  Administered 2019-10-16: 0.5 mL via INTRAMUSCULAR
  Filled 2019-10-11: qty 0.5

## 2019-10-11 MED ORDER — SODIUM CHLORIDE 0.9% FLUSH
3.0000 mL | Freq: Two times a day (BID) | INTRAVENOUS | Status: DC
  Administered 2019-10-11 – 2019-10-13 (×4): 3 mL via INTRAVENOUS

## 2019-10-11 NOTE — Progress Notes (Signed)
ANTICOAGULATION CONSULT NOTE - Initial Consult  Pharmacy Consult for IV heparin Indication: CAD, awaiting staged PCI  No Known Allergies  Patient Measurements: Height: 5\' 7"  (170.2 cm) Weight: 80.2 kg (176 lb 12.9 oz) IBW/kg (Calculated) : 66.1 Heparin Dosing Weight: 80.2 kg  Vital Signs: Temp: 98.5 F (36.9 C) (10/10 0803) Temp Source: Oral (10/10 0349) BP: 108/72 (10/10 0700)  Labs: Recent Labs    10/10/19 1515 10/10/19 1625 10/11/19 0314  HGB 14.8  --  14.0  HCT 42.6  --  40.7  PLT 142*  --  145*  CREATININE 0.93  --  0.72  TROPONINIHS >27,000* >27,000*  --     Estimated Creatinine Clearance: 103.3 mL/min (by C-G formula based on SCr of 0.72 mg/dL).   Medical History: Past Medical History:  Diagnosis Date  . Chest pain   . Family history of heart disease   . Hypertension     Medications:  Infusions:  . sodium chloride    . sodium chloride    . [START ON 10/12/2019] sodium chloride     Followed by  . [START ON 10/12/2019] sodium chloride    . heparin    . nitroGLYCERIN 25 mcg/min (10/11/19 12/11/19)    Assessment: 59 yo male admitted with STEMI, had cath w/ severe 2V CAD - s/p PCI to RCA yesterday, awaiting PCI to LAD/diag tomorrow.  Pharmacy asked to add IV heparin today for recurrent chest pain.  Received lovenox 40 mg ~ 0845 this AM.  Goal of Therapy:  Heparin level 0.3-0.7 units/ml Monitor platelets by anticoagulation protocol: Yes   Plan:  Start IV heparin at 950 units/hr, no bolus given recent lovenox. Check heparin level 6 hrs after gtt starts. Daily heparin level and CBC. F/u plans for heparin after cath tomorrow.  60, Reece Leader, BCCP Clinical Pharmacist  10/11/2019 9:28 AM   [facility] pharmacy phone numbers are listed on [email or web address]

## 2019-10-11 NOTE — Progress Notes (Signed)
Progress Note  Patient Name: Steven Mcneil Date of Encounter: 10/11/2019  [facility] Cardiologist:   Patient Profile     59 y.o. male admitted 10/9 with Inf STEMI in setting of DM, HTN, HLD Cath >> RCA-100>>stent; LADp-85; D1-95%;  Cx-OM1-30%  EF 45 % LVEDP 21   Subjective   Some recurrent discomfort this am, started on NTG     None at this time   Inpatient Medications    Scheduled Meds: . aspirin  81 mg Oral Daily  . atorvastatin  80 mg Oral Daily  . Chlorhexidine Gluconate Cloth  6 each Topical Daily  . enoxaparin (LOVENOX) injection  40 mg Subcutaneous Q24H  . [START ON 10/12/2019] influenza vac split quadrivalent PF  0.5 mL Intramuscular Tomorrow-1000  . insulin aspart  0-15 Units Subcutaneous TID WC  . melatonin  3 mg Oral QHS  . pantoprazole  40 mg Oral Daily  . sodium chloride flush  3 mL Intravenous Q12H  . ticagrelor  90 mg Oral BID   Continuous Infusions: . sodium chloride    . nitroGLYCERIN 25 mcg/min (10/11/19 0263)   PRN Meds: sodium chloride, acetaminophen, nitroGLYCERIN, ondansetron (ZOFRAN) IV, sodium chloride flush   Vital Signs    Vitals:   10/11/19 0400 10/11/19 0500 10/11/19 0600 10/11/19 0700  BP: 103/80 100/73 96/74 108/72  Pulse:      Resp: 19 18 (!) 21 20  Temp:      TempSrc:      SpO2: 96% 94% 95% 95%  Weight:      Height:        Intake/Output Summary (Last 24 hours) at 10/11/2019 0741 Last data filed at 10/11/2019 0635 Gross per 24 hour  Intake 105.09 ml  Output --  Net 105.09 ml   Last 3 Weights 10/10/2019 10/17/2012 10/02/2012  Weight (lbs) 176 lb 12.9 oz 178 lb 179 lb  Weight (kg) 80.2 kg 80.74 kg 81.194 kg      Telemetry    VT NS - Personally Reviewed  ECG    Sinus  Infer Qwaves and persistent mild STE  Not much different from post procedure - Personally Reviewed  Physical Exam    GEN: No acute distress.   Neck: No JVD Cardiac: RRR, no murmurs, rubs, or gallops.  Respiratory: Clear to auscultation  bilaterally. GI: Soft, nontender, non-distended  MS: No edema; No deformity. Neuro:  Nonfocal  Psych: Normal affect   Labs    High Sensitivity Troponin:   Recent Labs  Lab 10/10/19 1515 10/10/19 1625  TROPONINIHS >27,000* >27,000*      Chemistry Recent Labs  Lab 10/10/19 1515 10/11/19 0314  NA 137 134*  K 3.7 3.4*  CL 104 102  CO2 21* 21*  GLUCOSE 180* 167*  BUN 11 9  CREATININE 0.93 0.72  CALCIUM 8.7* 8.6*  PROT 6.1*  --   ALBUMIN 3.6  --   AST 283*  --   ALT 55*  --   ALKPHOS 54  --   BILITOT 0.8  --   GFRNONAA >60 >60  ANIONGAP 12 11     Hematology Recent Labs  Lab 10/10/19 1515 10/11/19 0314  WBC 12.2* 15.6*  RBC 4.61 4.37  HGB 14.8 14.0  HCT 42.6 40.7  MCV 92.4 93.1  MCH 32.1 32.0  MCHC 34.7 34.4  RDW 11.7 11.9  PLT 142* 145*    BNPNo results for input(s): BNP, PROBNP in the last 168 hours.   DDimer No results for input(s): DDIMER in  the last 168 hours.   Radiology    CARDIAC CATHETERIZATION  Result Date: 10/10/2019  RV Branch lesion is 75% stenosed.  Dist RCA lesion is 100% stenosed.  1st Mrg lesion is 30% stenosed.  Prox LAD lesion is 85% stenosed.  Mid LAD lesion is 50% stenosed.  1st Diag lesion is 95% stenosed.  RPAV lesion is 95% stenosed.  Post intervention, there is a 0% residual stenosis.  A drug-eluting stent was successfully placed using a STENT RESOLUTE ONYX 2.5X30.  Post intervention, there is a 0% residual stenosis.  A stent was successfully placed.  There is mild left ventricular systolic dysfunction.  LV end diastolic pressure is mildly elevated.  The left ventricular ejection fraction is 45-50% by visual estimate.  1. Severe 2 vessel obstructive CAD.    - 100% distal RCA occlusion- this is the culprit    - 85% proximal LAD bifurcation stenosis involving the first diagonal which is 95% stenosis. Medina class 1,1,1. 2. Mild LV dysfunction 3. Mildly elevated LVEDP 20 mm Hg 4. Successful PCI of the distal RCA extending  into a large PL branch with DES x 1. Plan: DAPT for one year. Anticipate return in 2 days for complex bifurcation PCI of the LAD/diagonal. Risk factor modification.    Cardiac Studies   Echo p    Assessment & Plan    STEMI-- RCA stent  Recurrent chest pain   CAD -residual highgrade LAD/D1  HTN  DM   Hypokalemia     Pt with some recurrent discomfort--now on NTG  ECG no significant changes  Will continue current meds  Reviewed plan for PCI staged in am  K repleted   Reviewed with PJ and will begin IV heparin        For questions or updates, please contact [facility] Please consult [email or web address] for contact info under        Signed, Sherryl Manges, MD  10/11/2019, 7:41 AM

## 2019-10-11 NOTE — Progress Notes (Signed)
  Echocardiogram 2D Echocardiogram has been performed.  Delcie Roch 10/11/2019, 10:12 AM

## 2019-10-11 NOTE — Progress Notes (Signed)
ANTICOAGULATION CONSULT NOTE  Pharmacy Consult for IV heparin Indication: CAD, awaiting staged PCI  No Known Allergies  Patient Measurements: Height: 5\' 7"  (170.2 cm) Weight: 80.2 kg (176 lb 12.9 oz) IBW/kg (Calculated) : 66.1 Heparin Dosing Weight: 80.2 kg  Vital Signs: Temp: 100.2 F (37.9 C) (10/10 1635) BP: 101/74 (10/10 1700)  Labs: Recent Labs    10/10/19 1515 10/10/19 1625 10/11/19 0314 10/11/19 1631  HGB 14.8  --  14.0  --   HCT 42.6  --  40.7  --   PLT 142*  --  145*  --   HEPARINUNFRC  --   --   --  0.41  CREATININE 0.93  --  0.72  --   TROPONINIHS >27,000* >27,000*  --   --     Estimated Creatinine Clearance: 103.3 mL/min (by C-G formula based on SCr of 0.72 mg/dL).   Medical History: Past Medical History:  Diagnosis Date  . Chest pain   . Family history of heart disease   . Hypertension     Medications:  Infusions:  . sodium chloride    . sodium chloride    . [START ON 10/12/2019] sodium chloride     Followed by  . [START ON 10/12/2019] sodium chloride    . heparin 950 Units/hr (10/11/19 1700)  . nitroGLYCERIN Stopped (10/11/19 1113)    Assessment: 59 yo male admitted with STEMI, had cath w/ severe 2V CAD - s/p PCI to RCA yesterday, awaiting PCI to LAD/diag tomorrow.  Pharmacy asked to dose IV heparin  for recurrent chest pain. -initial heparin level at goal   Goal of Therapy:  Heparin level 0.3-0.7 units/ml Monitor platelets by anticoagulation protocol: Yes   Plan:  Continue IV heparin at 950 units/hr Daily heparin level and CBC. F/u plans for heparin after cath tomorrow.  60, PharmD Clinical Pharmacist **Pharmacist phone directory can now be found on [email or web address] ([email or web address]).  Listed under [facility].

## 2019-10-11 NOTE — Plan of Care (Signed)
  Problem: Education: Goal: Knowledge of General Education information will improve Description: Including pain rating scale, medication(s)/side effects and non-pharmacologic comfort measures Outcome: Progressing   Problem: Clinical Measurements: Goal: Will remain free from infection Outcome: Progressing Goal: Respiratory complications will improve Outcome: Progressing   Problem: Nutrition: Goal: Adequate nutrition will be maintained Outcome: Progressing   Problem: Coping: Goal: Level of anxiety will decrease Outcome: Progressing   Problem: Elimination: Goal: Will not experience complications related to bowel motility Outcome: Progressing Goal: Will not experience complications related to urinary retention Outcome: Progressing   Problem: Pain Managment: Goal: General experience of comfort will improve Outcome: Progressing   Problem: Safety: Goal: Ability to remain free from injury will improve Outcome: Progressing   Problem: Skin Integrity: Goal: Risk for impaired skin integrity will decrease Outcome: Progressing

## 2019-10-12 ENCOUNTER — Encounter (HOSPITAL_COMMUNITY): Payer: Self-pay | Admitting: Cardiology

## 2019-10-12 ENCOUNTER — Encounter (HOSPITAL_COMMUNITY)
Admission: EM | Disposition: A | Discharge status: Home / Self Care | Payer: Self-pay | Source: Home / Self Care | Attending: Cardiology

## 2019-10-12 DIAGNOSIS — I10 Essential (primary) hypertension: Secondary | ICD-10-CM | POA: Diagnosis not present

## 2019-10-12 DIAGNOSIS — I25110 Atherosclerotic heart disease of native coronary artery with unstable angina pectoris: Secondary | ICD-10-CM

## 2019-10-12 DIAGNOSIS — I2111 ST elevation (STEMI) myocardial infarction involving right coronary artery: Secondary | ICD-10-CM | POA: Diagnosis not present

## 2019-10-12 HISTORY — PX: LEFT HEART CATH AND CORONARY ANGIOGRAPHY: CATH118249

## 2019-10-12 HISTORY — PX: INTRAVASCULAR ULTRASOUND/IVUS: CATH118244

## 2019-10-12 HISTORY — PX: CORONARY STENT INTERVENTION: CATH118234

## 2019-10-12 LAB — BASIC METABOLIC PANEL
Anion gap: 10 (ref 5–15)
BUN: 7 mg/dL (ref 6–20)
CO2: 24 mmol/L (ref 22–32)
Calcium: 8.8 mg/dL — ABNORMAL LOW (ref 8.9–10.3)
Chloride: 104 mmol/L (ref 98–111)
Creatinine, Ser: 0.78 mg/dL (ref 0.61–1.24)
GFR, Estimated: >60 mL/min (ref >60)
Glucose, Bld: 145 mg/dL — ABNORMAL HIGH (ref 70–99)
Potassium: 3.7 mmol/L (ref 3.5–5.1)
Sodium: 138 mmol/L (ref 135–145)

## 2019-10-12 LAB — CBC
HCT: 42.2 % (ref 39.0–52.0)
Hemoglobin: 14.2 g/dL (ref 13.0–17.0)
MCH: 31.1 pg (ref 26.0–34.0)
MCHC: 33.6 g/dL (ref 30.0–36.0)
MCV: 92.5 fL (ref 80.0–100.0)
Platelets: 141 10*3/uL — ABNORMAL LOW (ref 150–400)
RBC: 4.56 MIL/uL (ref 4.22–5.81)
RDW: 11.9 % (ref 11.5–15.5)
WBC: 13.9 10*3/uL — ABNORMAL HIGH (ref 4.0–10.5)
nRBC: 0 % (ref 0.0–0.2)

## 2019-10-12 LAB — POCT I-STAT, CHEM 8
BUN: 15 mg/dL (ref 6–20)
Calcium, Ion: 1.18 mmol/L (ref 1.15–1.40)
Chloride: 100 mmol/L (ref 98–111)
Creatinine, Ser: 0.6 mg/dL — ABNORMAL LOW (ref 0.61–1.24)
Glucose, Bld: 215 mg/dL — ABNORMAL HIGH (ref 70–99)
HCT: 43 % (ref 39.0–52.0)
Hemoglobin: 14.6 g/dL (ref 13.0–17.0)
Potassium: 3.6 mmol/L (ref 3.5–5.1)
Sodium: 136 mmol/L (ref 135–145)
TCO2: 20 mmol/L — ABNORMAL LOW (ref 22–32)

## 2019-10-12 LAB — POCT ACTIVATED CLOTTING TIME
Activated Clotting Time: 285 seconds
Activated Clotting Time: 532 seconds
Activated Clotting Time: 268 seconds
Activated Clotting Time: 455 seconds

## 2019-10-12 LAB — GLUCOSE, CAPILLARY
Glucose-Capillary: 150 mg/dL — ABNORMAL HIGH (ref 70–99)
Glucose-Capillary: 137 mg/dL — ABNORMAL HIGH (ref 70–99)
Glucose-Capillary: 156 mg/dL — ABNORMAL HIGH (ref 70–99)

## 2019-10-12 LAB — HEPARIN LEVEL (UNFRACTIONATED): Heparin Unfractionated: 0.39 IU/mL (ref 0.30–0.70)

## 2019-10-12 SURGERY — CORONARY STENT INTERVENTION
Anesthesia: LOCAL

## 2019-10-12 MED ORDER — HEPARIN (PORCINE) IN NACL 1000-0.9 UT/500ML-% IV SOLN
INTRAVENOUS | Status: DC | PRN
  Administered 2019-10-12 (×2): 500 mL

## 2019-10-12 MED ORDER — POTASSIUM CHLORIDE CRYS ER 20 MEQ PO TBCR
40.0000 meq | EXTENDED_RELEASE_TABLET | Freq: Once | ORAL | Status: AC
  Administered 2019-10-12: 40 meq via ORAL
  Filled 2019-10-12: qty 2

## 2019-10-12 MED ORDER — FENTANYL CITRATE (PF) 100 MCG/2ML IJ SOLN
INTRAMUSCULAR | Status: DC | PRN
Start: 2019-10-12 — End: 2019-10-12
  Administered 2019-10-12 (×2): 25 ug via INTRAVENOUS

## 2019-10-12 MED ORDER — ONDANSETRON HCL 4 MG/2ML IJ SOLN: 4.0000 mg | Freq: Four times a day (QID) | INTRAMUSCULAR | Status: DC | PRN

## 2019-10-12 MED ORDER — HYDROMORPHONE HCL 1 MG/ML IJ SOLN
INTRAMUSCULAR | Status: DC | PRN
  Administered 2019-10-12 (×2): 0.5 mg via INTRAVENOUS

## 2019-10-12 MED ORDER — MIDAZOLAM HCL 2 MG/2ML IJ SOLN
INTRAMUSCULAR | Status: DC | PRN
  Administered 2019-10-12 (×2): 1 mg via INTRAVENOUS

## 2019-10-12 MED ORDER — TICAGRELOR 90 MG PO TABS
90.0000 mg | ORAL_TABLET | Freq: Two times a day (BID) | ORAL | Status: DC
  Administered 2019-10-12 – 2019-10-16 (×8): 90 mg via ORAL
  Filled 2019-10-12 (×8): qty 1

## 2019-10-12 MED ORDER — LIDOCAINE HCL (PF) 1 % IJ SOLN
INTRAMUSCULAR | Status: AC
  Filled 2019-10-12: qty 30

## 2019-10-12 MED ORDER — ACETAMINOPHEN 325 MG PO TABS: 650.0000 mg | ORAL_TABLET | ORAL | Status: DC | PRN

## 2019-10-12 MED ORDER — HEPARIN (PORCINE) IN NACL 1000-0.9 UT/500ML-% IV SOLN
INTRAVENOUS | Status: AC
  Filled 2019-10-12: qty 1500

## 2019-10-12 MED ORDER — HYDROMORPHONE HCL 1 MG/ML IJ SOLN
INTRAMUSCULAR | Status: AC
  Filled 2019-10-12: qty 0.5

## 2019-10-12 MED ORDER — MIDAZOLAM HCL 2 MG/2ML IJ SOLN
INTRAMUSCULAR | Status: AC
  Filled 2019-10-12: qty 2

## 2019-10-12 MED ORDER — SODIUM CHLORIDE 0.9% FLUSH
3.0000 mL | Freq: Two times a day (BID) | INTRAVENOUS | Status: DC
  Administered 2019-10-13 – 2019-10-16 (×6): 3 mL via INTRAVENOUS

## 2019-10-12 MED ORDER — SODIUM CHLORIDE 0.9 % IV SOLN: 250.0000 mL | INTRAVENOUS | Status: DC | PRN

## 2019-10-12 MED ORDER — SODIUM CHLORIDE 0.9% FLUSH: 3.0000 mL | INTRAVENOUS | Status: DC | PRN

## 2019-10-12 MED ORDER — HYDRALAZINE HCL 20 MG/ML IJ SOLN: 10.0000 mg | INTRAMUSCULAR | Status: AC | PRN

## 2019-10-12 MED ORDER — VERAPAMIL HCL 2.5 MG/ML IV SOLN
INTRAVENOUS | Status: DC | PRN
  Administered 2019-10-12: 10 mL via INTRA_ARTERIAL

## 2019-10-12 MED ORDER — FENTANYL CITRATE (PF) 100 MCG/2ML IJ SOLN
INTRAMUSCULAR | Status: AC
  Filled 2019-10-12: qty 2

## 2019-10-12 MED ORDER — LABETALOL HCL 5 MG/ML IV SOLN: 10.0000 mg | INTRAVENOUS | Status: AC | PRN

## 2019-10-12 MED ORDER — SODIUM CHLORIDE 0.9 % IV SOLN: INTRAVENOUS | Status: AC

## 2019-10-12 MED ORDER — IOHEXOL 350 MG/ML SOLN
INTRAVENOUS | Status: DC | PRN
  Administered 2019-10-12: 100 mL

## 2019-10-12 MED ORDER — NITROGLYCERIN 1 MG/10 ML FOR IR/CATH LAB
INTRA_ARTERIAL | Status: DC | PRN
  Administered 2019-10-12: 200 ug via INTRA_ARTERIAL
  Administered 2019-10-12 (×3): 200 ug

## 2019-10-12 MED ORDER — SODIUM CHLORIDE 0.9 % IV SOLN
INTRAVENOUS | Status: AC | PRN
  Administered 2019-10-12: 250 mL/h via INTRAVENOUS

## 2019-10-12 MED ORDER — NITROGLYCERIN 1 MG/10 ML FOR IR/CATH LAB
INTRA_ARTERIAL | Status: AC
  Filled 2019-10-12: qty 10

## 2019-10-12 MED ORDER — IOHEXOL 350 MG/ML SOLN
INTRAVENOUS | Status: AC
  Filled 2019-10-12: qty 1

## 2019-10-12 MED ORDER — LIDOCAINE HCL (PF) 1 % IJ SOLN
INTRAMUSCULAR | Status: DC | PRN
  Administered 2019-10-12: 5 mL

## 2019-10-12 MED ORDER — HEPARIN SODIUM (PORCINE) 1000 UNIT/ML IJ SOLN
INTRAMUSCULAR | Status: DC | PRN
  Administered 2019-10-12: 9000 [IU] via INTRAVENOUS

## 2019-10-12 SURGICAL SUPPLY — 23 items
BALLN EMERGE MR 2.25X12 (BALLOONS) ×2
BALLN EMERGE MR 2.25X15 (BALLOONS) ×2
BALLN SAPPHIRE ~~LOC~~ 3.25X18 (BALLOONS) ×1 IMPLANT
BALLN SAPPHIRE ~~LOC~~ 3.5X12 (BALLOONS) ×1 IMPLANT
BALLOON EMERGE MR 2.25X12 (BALLOONS) IMPLANT
BALLOON EMERGE MR 2.25X15 (BALLOONS) IMPLANT
CATH INFINITI JR4 5F (CATHETERS) ×1 IMPLANT
CATH LAUNCHER 6FR EBU 3.75 (CATHETERS) ×1 IMPLANT
CATH OPTICROSS HD (CATHETERS) ×1 IMPLANT
DEVICE RAD COMP TR BAND LRG (VASCULAR PRODUCTS) ×2 IMPLANT
GLIDESHEATH SLEND SS 6F .021 (SHEATH) ×1 IMPLANT
GUIDEWIRE INQWIRE 1.5J.035X260 (WIRE) IMPLANT
INQWIRE 1.5J .035X260CM (WIRE) ×2
KIT ENCORE 26 ADVANTAGE (KITS) ×2 IMPLANT
KIT HEART LEFT (KITS) ×2 IMPLANT
KIT HEMO VALVE WATCHDOG (MISCELLANEOUS) ×1 IMPLANT
PACK CARDIAC CATHETERIZATION (CUSTOM PROCEDURE TRAY) ×2 IMPLANT
SLED PULL BACK IVUS (MISCELLANEOUS) ×1 IMPLANT
STENT RESOLUTE ONYX3.0X38 (Permanent Stent) ×1 IMPLANT
TRANSDUCER W/STOPCOCK (MISCELLANEOUS) ×2 IMPLANT
TUBING CIL FLEX 10 FLL-RA (TUBING) ×2 IMPLANT
WIRE ASAHI PROWATER 180CM (WIRE) ×1 IMPLANT
WIRE HI TORQ BMW 190CM (WIRE) ×1 IMPLANT

## 2019-10-12 NOTE — Interval H&P Note (Signed)
Cath Lab Visit (complete for each Cath Lab visit)  Clinical Evaluation Leading to the Procedure:   ACS: Yes.    Non-ACS:    Anginal Classification: CCS IV  Anti-ischemic medical therapy: Minimal Therapy (1 class of medications)  Non-Invasive Test Results: No non-invasive testing performed  Prior CABG: No previous CABG    Recent inferior STEMI.  Planned LAD intervention.  Discussed case with Dr. [place] and Dr. Esmond Camper.   History and Physical Interval Note:  10/12/2019 12:51 PM  Steven Mcneil  has presented today for surgery, with the diagnosis of CAD.  The various methods of treatment have been discussed with the patient and family. After consideration of risks, benefits and other options for treatment, the patient has consented to  Procedure(s): CORONARY STENT INTERVENTION (N/A) as a surgical intervention.  The patient's history has been reviewed, patient examined, no change in status, stable for surgery.  I have reviewed the patient's chart and labs.  Questions were answered to the patient's satisfaction.     Lance Muss

## 2019-10-12 NOTE — Progress Notes (Signed)
ANTICOAGULATION CONSULT NOTE  Pharmacy Consult for IV heparin Indication: CAD, awaiting staged PCI  No Known Allergies  Patient Measurements: Height: 5\' 7"  (170.2 cm) Weight: 80.2 kg (176 lb 12.9 oz) IBW/kg (Calculated) : 66.1 Heparin Dosing Weight: 80.2 kg  Vital Signs: Temp: 98.7 F (37.1 C) (10/11 0023) Temp Source: Oral (10/11 0023) BP: 87/61 (10/11 0600)  Labs: Recent Labs    10/10/19 1515 10/10/19 1515 10/10/19 1625 10/11/19 0314 10/11/19 1631 10/12/19 0303 10/12/19 0620  HGB 14.8   < >  --  14.0  --  14.2  --   HCT 42.6  --   --  40.7  --  42.2  --   PLT 142*  --   --  145*  --  141*  --   HEPARINUNFRC  --   --   --   --  0.41 0.39  --   CREATININE 0.93  --   --  0.72  --   --  0.78  TROPONINIHS >27,000*  --  >27,000*  --   --   --   --    < > = values in this interval not displayed.    Estimated Creatinine Clearance: 103.3 mL/min (by C-G formula based on SCr of 0.78 mg/dL).   Medical History: Past Medical History:  Diagnosis Date  . Chest pain   . Family history of heart disease   . Hypertension     Medications:  Infusions:  . sodium chloride    . sodium chloride    . sodium chloride    . heparin 950 Units/hr (10/12/19 0915)  . nitroGLYCERIN Stopped (10/11/19 1113)    Assessment: 59 yo male admitted with STEMI, had cath w/ severe 2V CAD - s/p PCI to RCA yesterday, awaiting PCI to LAD/diag 10/11.  Patient initially on Lovenox 40 mg daily for DVT prophylaxis. Pharmacy asked to transition to IV heparin today for recurrent chest pain.  AM heparin level remains therapeutic at 0.39. CBC stable with Hgb 14.2, Hct 42.2, and Plt 141. No bleeding noted per RN. Patient scheduled to go to cath lab this afternoon. Will continue current regimen and follow-up for plans post-cath.   Goal of Therapy:  Heparin level 0.3-0.7 units/ml Monitor platelets by anticoagulation protocol: Yes   Plan:  Continue IV heparin at 950 units/hr Daily heparin level and  CBC. F/u plans for heparin after cath  12/11, PharmD PGY1 Acute Care Pharmacy Resident  10/12/2019 10:49 AM  Please check [email or web address] for unit-specific pharmacy phone numbers.

## 2019-10-12 NOTE — Progress Notes (Signed)
 Progress Note  Patient Name: Steven Mcneil Date of Encounter: 10/12/2019  [facility] Cardiologist: Jordan  Subjective   No chest pain or dyspnea this am.   Inpatient Medications    Scheduled Meds: . aspirin  81 mg Oral Daily  . atorvastatin  80 mg Oral Daily  . Chlorhexidine Gluconate Cloth  6 each Topical Daily  . influenza vac split quadrivalent PF  0.5 mL Intramuscular Tomorrow-1000  . insulin aspart  0-15 Units Subcutaneous TID WC  . melatonin  3 mg Oral QHS  . pantoprazole  40 mg Oral Daily  . sodium chloride flush  3 mL Intravenous Q12H  . sodium chloride flush  3 mL Intravenous Q12H  . ticagrelor  90 mg Oral BID   Continuous Infusions: . sodium chloride    . sodium chloride    . sodium chloride    . heparin 950 Units/hr (10/12/19 0600)  . nitroGLYCERIN Stopped (10/11/19 1113)   PRN Meds: sodium chloride, sodium chloride, acetaminophen, nitroGLYCERIN, ondansetron (ZOFRAN) IV, sodium chloride flush, sodium chloride flush   Vital Signs    Vitals:   10/12/19 0300 10/12/19 0400 10/12/19 0500 10/12/19 0600  BP: (!) 94/57 (!) 89/66 92/67 (!) 87/61  Pulse:      Resp: (!) 21 18 (!) 21 19  Temp:      TempSrc:      SpO2: 94% 95% 95% 95%  Weight:      Height:        Intake/Output Summary (Last 24 hours) at 10/12/2019 0746 Last data filed at 10/12/2019 0600 Gross per 24 hour  Intake 1010.99 ml  Output 3200 ml  Net -2189.01 ml   Last 3 Weights 10/10/2019 10/17/2012 10/02/2012  Weight (lbs) 176 lb 12.9 oz 178 lb 179 lb  Weight (kg) 80.2 kg 80.74 kg 81.194 kg      Telemetry    Sinus, PVCs - Personally Reviewed  ECG    Sinus, lateral T wave inversion, inferior Q wave- Personally Reviewed  Physical Exam   GEN: No acute distress.   Neck: No JVD Cardiac: RRR, no murmurs, rubs, or gallops.  Respiratory: Clear to auscultation bilaterally. GI: Soft, nontender, non-distended  MS: No edema; No deformity. Neuro:  Nonfocal  Psych: Normal affect   Labs     High Sensitivity Troponin:   Recent Labs  Lab 10/10/19 1515 10/10/19 1625  TROPONINIHS >27,000* >27,000*      Chemistry Recent Labs  Lab 10/10/19 1515 10/11/19 0314 10/12/19 0620  NA 137 134* 138  K 3.7 3.4* 3.7  CL 104 102 104  CO2 21* 21* 24  GLUCOSE 180* 167* 145*  BUN 11 9 7  CREATININE 0.93 0.72 0.78  CALCIUM 8.7* 8.6* 8.8*  PROT 6.1*  --   --   ALBUMIN 3.6  --   --   AST 283*  --   --   ALT 55*  --   --   ALKPHOS 54  --   --   BILITOT 0.8  --   --   GFRNONAA >60 >60 >60  ANIONGAP 12 11 10     Hematology Recent Labs  Lab 10/10/19 1515 10/11/19 0314 10/12/19 0303  WBC 12.2* 15.6* 13.9*  RBC 4.61 4.37 4.56  HGB 14.8 14.0 14.2  HCT 42.6 40.7 42.2  MCV 92.4 93.1 92.5  MCH 32.1 32.0 31.1  MCHC 34.7 34.4 33.6  RDW 11.7 11.9 11.9  PLT 142* 145* 141*    BNPNo results for input(s): BNP, PROBNP in   the last 168 hours.   DDimer No results for input(s): DDIMER in the last 168 hours.   Radiology    CARDIAC CATHETERIZATION  Result Date: 10/10/2019  RV Branch lesion is 75% stenosed.  Dist RCA lesion is 100% stenosed.  1st Mrg lesion is 30% stenosed.  Prox LAD lesion is 85% stenosed.  Mid LAD lesion is 50% stenosed.  1st Diag lesion is 95% stenosed.  RPAV lesion is 95% stenosed.  Post intervention, there is a 0% residual stenosis.  A drug-eluting stent was successfully placed using a STENT RESOLUTE ONYX 2.5X30.  Post intervention, there is a 0% residual stenosis.  A stent was successfully placed.  There is mild left ventricular systolic dysfunction.  LV end diastolic pressure is mildly elevated.  The left ventricular ejection fraction is 45-50% by visual estimate.  1. Severe 2 vessel obstructive CAD.    - 100% distal RCA occlusion- this is the culprit    - 85% proximal LAD bifurcation stenosis involving the first diagonal which is 95% stenosis. Medina class 1,1,1. 2. Mild LV dysfunction 3. Mildly elevated LVEDP 20 mm Hg 4. Successful PCI of the distal RCA  extending into a large PL branch with DES x 1. Plan: DAPT for one year. Anticipate return in 2 days for complex bifurcation PCI of the LAD/diagonal. Risk factor modification.   ECHOCARDIOGRAM COMPLETE  Result Date: 10/11/2019    ECHOCARDIOGRAM REPORT   Patient Name:   Steven Mcneil Date of Exam: 10/11/2019 Medical Rec #:  [id number]  Height:       67.0 in Accession #:    [id number] Weight:       176.8 lb Date of Birth:  04/28/1960  BSA:          1.919 m Patient Age:    59 years   BP:           104/73 mmHg Patient Gender: M          HR:           92 bpm. Exam Location:  Inpatient Procedure: 2D Echo Indications:    acute myocardial infarction 410  History:        Patient has no prior history of Echocardiogram examinations.                 Signs/Symptoms:Chest Pain; Risk Factors:Diabetes, Hypertension                 and Family History of Coronary Artery Disease.  Sonographer:    Lauren Pennington Referring Phys: [id number] PETER M JORDAN IMPRESSIONS  1. Left ventricular ejection fraction, by estimation, is 50 to 55%. Left ventricular ejection fraction by 3D volume is 50 %. The left ventricle has low normal function. The left ventricle demonstrates global hypokinesis. Left ventricular diastolic parameters are consistent with Grade I diastolic dysfunction (impaired relaxation).  2. Right ventricular systolic function is normal. The right ventricular size is normal. Mildly increased right ventricular wall thickness.  3. The mitral valve is grossly normal. Trivial mitral valve regurgitation.  4. The aortic valve is tricuspid. Aortic valve regurgitation is not visualized.  5. The inferior vena cava is normal in size with greater than 50% respiratory variability, suggesting right atrial pressure of 3 mmHg. Comparison(s): No prior Echocardiogram. FINDINGS  Left Ventricle: Left ventricular ejection fraction, by estimation, is 50 to 55%. Left ventricular ejection fraction by 3D volume is 50 %. The left ventricle has low normal  function. The left ventricle demonstrates global hypokinesis. The left ventricular   internal cavity size was normal in size. There is no left ventricular hypertrophy. Left ventricular diastolic parameters are consistent with Grade I diastolic dysfunction (impaired relaxation). Right Ventricle: The right ventricular size is normal. Mildly increased right ventricular wall thickness. Right ventricular systolic function is normal. Left Atrium: Left atrial size was normal in size. Right Atrium: Right atrial size was normal in size. Pericardium: There is no evidence of pericardial effusion. Mitral Valve: The mitral valve is grossly normal. Trivial mitral valve regurgitation. Tricuspid Valve: The tricuspid valve is grossly normal. Tricuspid valve regurgitation is not demonstrated. Aortic Valve: The aortic valve is tricuspid. Aortic valve regurgitation is not visualized. Pulmonic Valve: The pulmonic valve was normal in structure. Pulmonic valve regurgitation is not visualized. Aorta: The aortic root and ascending aorta are structurally normal, with no evidence of dilitation. Venous: The inferior vena cava is normal in size with greater than 50% respiratory variability, suggesting right atrial pressure of 3 mmHg. IAS/Shunts: The atrial septum is grossly normal.  LEFT VENTRICLE PLAX 2D LVIDd:         4.70 cm         Diastology LVIDs:         3.30 cm         LV e' medial:  7.18 cm/s LV PW:         1.00 cm         LV e' lateral: 7.07 cm/s LV IVS:        0.90 cm LVOT diam:     2.20 cm LV SV:         58              3D Volume EF LV SV Index:   30              LV 3D EF:    Left LVOT Area:     3.80 cm                     ventricular                                             ejection                                             fraction by                                             3D volume                                             is 50 %.                                 3D Volume EF:                                3D EF:           50 % RIGHT VENTRICLE             IVC RV S prime:     12.60 cm/s  IVC diam: 1.80 cm TAPSE (M-mode): 1.5 cm LEFT ATRIUM           Index       RIGHT ATRIUM           Index LA diam:      3.70 cm 1.93 cm/m  RA Area:     11.50 cm LA Vol (A4C): 41.5 ml 21.63 ml/m RA Volume:   23.30 ml  12.14 ml/m  AORTIC VALVE LVOT Vmax:   93.30 cm/s LVOT Vmean:  59.900 cm/s LVOT VTI:    0.153 m  AORTA Ao Root diam: 3.60 cm Ao Asc diam:  3.50 cm  SHUNTS Systemic VTI:  0.15 m Systemic Diam: 2.20 cm Mahesh Chandrasekhar MD Electronically signed by Mahesh Chandrasekhar MD Signature Date/Time: 10/11/2019/1:16:45 PM    Final     Cardiac Studies     Patient Profile     59 y.o. male with DM, HTN, HLD admitted 10/10/19 with an acute inferior STEMI secondary to an occluded distal RCA. He is s/p drug eluting stent placement in the distal RCA. For staged PCI of residual LAD stenosis.   Assessment & Plan    1. CAD/Inferior STEMI: No chest pain this am. Continue ASA, Brilinta and statin. Plan staged PCI of the LAD/Diagonal today. Will review with the IC team. I would consider angioplasty of the Diagonal branch and stenting of the LAD only. Soft BP so will not add a beta blocker or Ace-inh.   2. HTN: BP is soft.    3. HLD: continue high intensity statin.    For questions or updates, please contact [facility] Please consult [email or web address] for contact info under        Signed, Dakoda Laventure, MD  10/12/2019, 7:46 AM    

## 2019-10-12 NOTE — H&P (View-Only) (Signed)
Progress Note  Patient Name: Steven Mcneil Date of Encounter: 10/12/2019  [facility] Cardiologist: [place]  Subjective   No chest pain or dyspnea this am.   Inpatient Medications    Scheduled Meds: . aspirin  81 mg Oral Daily  . atorvastatin  80 mg Oral Daily  . Chlorhexidine Gluconate Cloth  6 each Topical Daily  . influenza vac split quadrivalent PF  0.5 mL Intramuscular Tomorrow-1000  . insulin aspart  0-15 Units Subcutaneous TID WC  . melatonin  3 mg Oral QHS  . pantoprazole  40 mg Oral Daily  . sodium chloride flush  3 mL Intravenous Q12H  . sodium chloride flush  3 mL Intravenous Q12H  . ticagrelor  90 mg Oral BID   Continuous Infusions: . sodium chloride    . sodium chloride    . sodium chloride    . heparin 950 Units/hr (10/12/19 0600)  . nitroGLYCERIN Stopped (10/11/19 1113)   PRN Meds: sodium chloride, sodium chloride, acetaminophen, nitroGLYCERIN, ondansetron (ZOFRAN) IV, sodium chloride flush, sodium chloride flush   Vital Signs    Vitals:   10/12/19 0300 10/12/19 0400 10/12/19 0500 10/12/19 0600  BP: (!) 94/57 (!) 89/66 92/67 (!) 87/61  Pulse:      Resp: (!) 21 18 (!) 21 19  Temp:      TempSrc:      SpO2: 94% 95% 95% 95%  Weight:      Height:        Intake/Output Summary (Last 24 hours) at 10/12/2019 0746 Last data filed at 10/12/2019 0600 Gross per 24 hour  Intake 1010.99 ml  Output 3200 ml  Net -2189.01 ml   Last 3 Weights 10/10/2019 10/17/2012 10/02/2012  Weight (lbs) 176 lb 12.9 oz 178 lb 179 lb  Weight (kg) 80.2 kg 80.74 kg 81.194 kg      Telemetry    Sinus, PVCs - Personally Reviewed  ECG    Sinus, lateral T wave inversion, inferior Q wave- Personally Reviewed  Physical Exam   GEN: No acute distress.   Neck: No JVD Cardiac: RRR, no murmurs, rubs, or gallops.  Respiratory: Clear to auscultation bilaterally. GI: Soft, nontender, non-distended  MS: No edema; No deformity. Neuro:  Nonfocal  Psych: Normal affect   Labs     High Sensitivity Troponin:   Recent Labs  Lab 10/10/19 1515 10/10/19 1625  TROPONINIHS >27,000* >27,000*      Chemistry Recent Labs  Lab 10/10/19 1515 10/11/19 0314 10/12/19 0620  NA 137 134* 138  K 3.7 3.4* 3.7  CL 104 102 104  CO2 21* 21* 24  GLUCOSE 180* 167* 145*  BUN 11 9 7   CREATININE 0.93 0.72 0.78  CALCIUM 8.7* 8.6* 8.8*  PROT 6.1*  --   --   ALBUMIN 3.6  --   --   AST 283*  --   --   ALT 55*  --   --   ALKPHOS 54  --   --   BILITOT 0.8  --   --   GFRNONAA >60 >60 >60  ANIONGAP 12 11 10      Hematology Recent Labs  Lab 10/10/19 1515 10/11/19 0314 10/12/19 0303  WBC 12.2* 15.6* 13.9*  RBC 4.61 4.37 4.56  HGB 14.8 14.0 14.2  HCT 42.6 40.7 42.2  MCV 92.4 93.1 92.5  MCH 32.1 32.0 31.1  MCHC 34.7 34.4 33.6  RDW 11.7 11.9 11.9  PLT 142* 145* 141*    BNPNo results for input(s): BNP, PROBNP in  the last 168 hours.   DDimer No results for input(s): DDIMER in the last 168 hours.   Radiology    CARDIAC CATHETERIZATION  Result Date: 10/10/2019  RV Branch lesion is 75% stenosed.  Dist RCA lesion is 100% stenosed.  1st Mrg lesion is 30% stenosed.  Prox LAD lesion is 85% stenosed.  Mid LAD lesion is 50% stenosed.  1st Diag lesion is 95% stenosed.  RPAV lesion is 95% stenosed.  Post intervention, there is a 0% residual stenosis.  A drug-eluting stent was successfully placed using a STENT RESOLUTE ONYX 2.5X30.  Post intervention, there is a 0% residual stenosis.  A stent was successfully placed.  There is mild left ventricular systolic dysfunction.  LV end diastolic pressure is mildly elevated.  The left ventricular ejection fraction is 45-50% by visual estimate.  1. Severe 2 vessel obstructive CAD.    - 100% distal RCA occlusion- this is the culprit    - 85% proximal LAD bifurcation stenosis involving the first diagonal which is 95% stenosis. Medina class 1,1,1. 2. Mild LV dysfunction 3. Mildly elevated LVEDP 20 mm Hg 4. Successful PCI of the distal RCA  extending into a large PL branch with DES x 1. Plan: DAPT for one year. Anticipate return in 2 days for complex bifurcation PCI of the LAD/diagonal. Risk factor modification.   ECHOCARDIOGRAM COMPLETE  Result Date: 10/11/2019    ECHOCARDIOGRAM REPORT   Patient Name:   Steven Mcneil Date of Exam: 10/11/2019 Medical Rec #:  [id number]  Height:       67.0 in Accession #:    [id number] Weight:       176.8 lb Date of Birth:  Dec 27, 1960  BSA:          1.919 m Patient Age:    59 years   BP:           104/73 mmHg Patient Gender: M          HR:           92 bpm. Exam Location:  Inpatient Procedure: 2D Echo Indications:    acute myocardial infarction 410  History:        Patient has no prior history of Echocardiogram examinations.                 Signs/Symptoms:Chest Pain; Risk Factors:Diabetes, Hypertension                 and Family History of Coronary Artery Disease.  Sonographer:    Delcie Roch Referring Phys: [phone] PETER M [place] IMPRESSIONS  1. Left ventricular ejection fraction, by estimation, is 50 to 55%. Left ventricular ejection fraction by 3D volume is 50 %. The left ventricle has low normal function. The left ventricle demonstrates global hypokinesis. Left ventricular diastolic parameters are consistent with Grade I diastolic dysfunction (impaired relaxation).  2. Right ventricular systolic function is normal. The right ventricular size is normal. Mildly increased right ventricular wall thickness.  3. The mitral valve is grossly normal. Trivial mitral valve regurgitation.  4. The aortic valve is tricuspid. Aortic valve regurgitation is not visualized.  5. The inferior vena cava is normal in size with greater than 50% respiratory variability, suggesting right atrial pressure of 3 mmHg. Comparison(s): No prior Echocardiogram. FINDINGS  Left Ventricle: Left ventricular ejection fraction, by estimation, is 50 to 55%. Left ventricular ejection fraction by 3D volume is 50 %. The left ventricle has low normal  function. The left ventricle demonstrates global hypokinesis. The left ventricular  internal cavity size was normal in size. There is no left ventricular hypertrophy. Left ventricular diastolic parameters are consistent with Grade I diastolic dysfunction (impaired relaxation). Right Ventricle: The right ventricular size is normal. Mildly increased right ventricular wall thickness. Right ventricular systolic function is normal. Left Atrium: Left atrial size was normal in size. Right Atrium: Right atrial size was normal in size. Pericardium: There is no evidence of pericardial effusion. Mitral Valve: The mitral valve is grossly normal. Trivial mitral valve regurgitation. Tricuspid Valve: The tricuspid valve is grossly normal. Tricuspid valve regurgitation is not demonstrated. Aortic Valve: The aortic valve is tricuspid. Aortic valve regurgitation is not visualized. Pulmonic Valve: The pulmonic valve was normal in structure. Pulmonic valve regurgitation is not visualized. Aorta: The aortic root and ascending aorta are structurally normal, with no evidence of dilitation. Venous: The inferior vena cava is normal in size with greater than 50% respiratory variability, suggesting right atrial pressure of 3 mmHg. IAS/Shunts: The atrial septum is grossly normal.  LEFT VENTRICLE PLAX 2D LVIDd:         4.70 cm         Diastology LVIDs:         3.30 cm         LV e' medial:  7.18 cm/s LV PW:         1.00 cm         LV e' lateral: 7.07 cm/s LV IVS:        0.90 cm LVOT diam:     2.20 cm LV SV:         58              3D Volume EF LV SV Index:   30              LV 3D EF:    Left LVOT Area:     3.80 cm                     ventricular                                             ejection                                             fraction by                                             3D volume                                             is 50 %.                                 3D Volume EF:                                3D EF:  50 % RIGHT VENTRICLE             IVC RV S prime:     12.60 cm/s  IVC diam: 1.80 cm TAPSE (M-mode): 1.5 cm LEFT ATRIUM           Index       RIGHT ATRIUM           Index LA diam:      3.70 cm 1.93 cm/m  RA Area:     11.50 cm LA Vol (A4C): 41.5 ml 21.63 ml/m RA Volume:   23.30 ml  12.14 ml/m  AORTIC VALVE LVOT Vmax:   93.30 cm/s LVOT Vmean:  59.900 cm/s LVOT VTI:    0.153 m  AORTA Ao Root diam: 3.60 cm Ao Asc diam:  3.50 cm  SHUNTS Systemic VTI:  0.15 m Systemic Diam: 2.20 cm Riley Lam MD Electronically signed by Riley Lam MD Signature Date/Time: 10/11/2019/1:16:45 PM    Final     Cardiac Studies     Patient Profile     59 y.o. male with DM, HTN, HLD admitted 10/10/19 with an acute inferior STEMI secondary to an occluded distal RCA. He is s/p drug eluting stent placement in the distal RCA. For staged PCI of residual LAD stenosis.   Assessment & Plan    1. CAD/Inferior STEMI: No chest pain this am. Continue ASA, Brilinta and statin. Plan staged PCI of the LAD/Diagonal today. Will review with the IC team. I would consider angioplasty of the Diagonal branch and stenting of the LAD only. Soft BP so will not add a beta blocker or Ace-inh.   2. HTN: BP is soft.    3. HLD: continue high intensity statin.    For questions or updates, please contact [facility] Please consult [email or web address] for contact info under        Signed, Verne Carrow, MD  10/12/2019, 7:46 AM

## 2019-10-13 ENCOUNTER — Encounter (HOSPITAL_COMMUNITY): Payer: Self-pay | Admitting: Interventional Cardiology

## 2019-10-13 ENCOUNTER — Encounter (HOSPITAL_COMMUNITY): Payer: Self-pay

## 2019-10-13 DIAGNOSIS — I25110 Atherosclerotic heart disease of native coronary artery with unstable angina pectoris: Secondary | ICD-10-CM | POA: Diagnosis not present

## 2019-10-13 LAB — CBC
HCT: 44.1 % (ref 39.0–52.0)
Hemoglobin: 14.9 g/dL (ref 13.0–17.0)
MCH: 31.2 pg (ref 26.0–34.0)
MCHC: 33.8 g/dL (ref 30.0–36.0)
MCV: 92.3 fL (ref 80.0–100.0)
Platelets: 138 10*3/uL — ABNORMAL LOW (ref 150–400)
RBC: 4.78 MIL/uL (ref 4.22–5.81)
RDW: 11.8 % (ref 11.5–15.5)
WBC: 13.8 10*3/uL — ABNORMAL HIGH (ref 4.0–10.5)
nRBC: 0 % (ref 0.0–0.2)

## 2019-10-13 LAB — GLUCOSE, CAPILLARY
Glucose-Capillary: 124 mg/dL — ABNORMAL HIGH (ref 70–99)
Glucose-Capillary: 148 mg/dL — ABNORMAL HIGH (ref 70–99)
Glucose-Capillary: 124 mg/dL — ABNORMAL HIGH (ref 70–99)
Glucose-Capillary: 142 mg/dL — ABNORMAL HIGH (ref 70–99)

## 2019-10-13 LAB — BASIC METABOLIC PANEL
Anion gap: 12 (ref 5–15)
BUN: 8 mg/dL (ref 6–20)
CO2: 21 mmol/L — ABNORMAL LOW (ref 22–32)
Calcium: 9.2 mg/dL (ref 8.9–10.3)
Chloride: 104 mmol/L (ref 98–111)
Creatinine, Ser: 0.76 mg/dL (ref 0.61–1.24)
GFR, Estimated: >60 mL/min (ref >60)
Glucose, Bld: 136 mg/dL — ABNORMAL HIGH (ref 70–99)
Potassium: 3.9 mmol/L (ref 3.5–5.1)
Sodium: 137 mmol/L (ref 135–145)

## 2019-10-13 MED ORDER — POTASSIUM CHLORIDE CRYS ER 20 MEQ PO TBCR
40.0000 meq | EXTENDED_RELEASE_TABLET | Freq: Once | ORAL | Status: AC
  Administered 2019-10-13: 40 meq via ORAL
  Filled 2019-10-13: qty 2

## 2019-10-13 NOTE — Plan of Care (Signed)
  Problem: Education: Goal: Knowledge of General Education information will improve Description: Including pain rating scale, medication(s)/side effects and non-pharmacologic comfort measures Outcome: Progressing   Problem: Health Behavior/Discharge Planning: Goal: Ability to manage health-related needs will improve Outcome: Progressing   Problem: Clinical Measurements: Goal: Ability to maintain clinical measurements within normal limits will improve Outcome: Progressing Goal: Will remain free from infection Outcome: Progressing Goal: Diagnostic test results will improve Outcome: Progressing   Problem: Nutrition: Goal: Adequate nutrition will be maintained Outcome: Progressing   Problem: Skin Integrity: Goal: Risk for impaired skin integrity will decrease Outcome: Progressing   Problem: Elimination: Goal: Will not experience complications related to urinary retention Outcome: Progressing

## 2019-10-13 NOTE — Progress Notes (Signed)
Progress Note  Patient Name: Steven Mcneil Date of Encounter: 10/13/2019  [facility] Cardiologist: [place]  Subjective   No chest pain. Some dizziness this am when ambulating.   Inpatient Medications    Scheduled Meds: . aspirin  81 mg Oral Daily  . atorvastatin  80 mg Oral Daily  . Chlorhexidine Gluconate Cloth  6 each Topical Daily  . influenza vac split quadrivalent PF  0.5 mL Intramuscular Tomorrow-1000  . insulin aspart  0-15 Units Subcutaneous TID WC  . melatonin  3 mg Oral QHS  . pantoprazole  40 mg Oral Daily  . potassium chloride  40 mEq Oral Once  . sodium chloride flush  3 mL Intravenous Q12H  . sodium chloride flush  3 mL Intravenous Q12H  . sodium chloride flush  3 mL Intravenous Q12H  . ticagrelor  90 mg Oral BID   Continuous Infusions: . sodium chloride    . sodium chloride    . nitroGLYCERIN Stopped (10/11/19 1113)   PRN Meds: sodium chloride, sodium chloride, acetaminophen, nitroGLYCERIN, ondansetron (ZOFRAN) IV, sodium chloride flush, sodium chloride flush   Vital Signs    Vitals:   10/13/19 0400 10/13/19 0500 10/13/19 0600 10/13/19 0700  BP: (!) 110/92 113/76 108/87 99/77  Pulse:      Resp: 17 19 20 17   Temp:    97.9 F (36.6 C)  TempSrc:    Axillary  SpO2: 97% 96% 94% 96%  Weight:      Height:        Intake/Output Summary (Last 24 hours) at 10/13/2019 0800 Last data filed at 10/13/2019 0600 Gross per 24 hour  Intake 1013.33 ml  Output 3200 ml  Net -2186.67 ml   Last 3 Weights 10/10/2019 10/17/2012 10/02/2012  Weight (lbs) 176 lb 12.9 oz 178 lb 179 lb  Weight (kg) 80.2 kg 80.74 kg 81.194 kg      Telemetry    Sinus - Personally Reviewed  ECG    Sinus rhythm, inferior Q waves. Personally reviewed  Physical Exam   General: Well developed, well nourished, NAD  HEENT: OP clear, mucus membranes moist  SKIN: warm, dry. No rashes. Neuro: No focal deficits  Musculoskeletal: Muscle strength 5/5 all ext  Psychiatric: Mood and  affect normal  Neck: No JVD, no carotid bruits, no thyromegaly, no lymphadenopathy.  Lungs:Clear bilaterally, no wheezes, rhonci, crackles Cardiovascular: Regular rate and rhythm. No murmurs, gallops or rubs. Abdomen:Soft. Bowel sounds present. Non-tender.  Extremities: No lower extremity edema. Pulses are 2 + in the bilateral DP/PT.    Labs    High Sensitivity Troponin:   Recent Labs  Lab 10/10/19 1515 10/10/19 1625  TROPONINIHS >27,000* >27,000*      Chemistry Recent Labs  Lab 10/10/19 1515 10/10/19 1515 10/11/19 0314 10/12/19 0620 10/13/19 0146  NA 137   < > 134* 138 137  K 3.7   < > 3.4* 3.7 3.9  CL 104   < > 102 104 104  CO2 21*   < > 21* 24 21*  GLUCOSE 180*   < > 167* 145* 136*  BUN 11   < > 9 7 8   CREATININE 0.93   < > 0.72 0.78 0.76  CALCIUM 8.7*   < > 8.6* 8.8* 9.2  PROT 6.1*  --   --   --   --   ALBUMIN 3.6  --   --   --   --   AST 283*  --   --   --   --  ALT 55*  --   --   --   --   ALKPHOS 54  --   --   --   --   BILITOT 0.8  --   --   --   --   GFRNONAA >60   < > >60 >60 >60  ANIONGAP 12   < > 11 10 12    < > = values in this interval not displayed.     Hematology Recent Labs  Lab 10/11/19 0314 10/12/19 0303 10/13/19 0146  WBC 15.6* 13.9* 13.8*  RBC 4.37 4.56 4.78  HGB 14.0 14.2 14.9  HCT 40.7 42.2 44.1  MCV 93.1 92.5 92.3  MCH 32.0 31.1 31.2  MCHC 34.4 33.6 33.8  RDW 11.9 11.9 11.8  PLT 145* 141* 138*    BNPNo results for input(s): BNP, PROBNP in the last 168 hours.   DDimer No results for input(s): DDIMER in the last 168 hours.   Radiology    CARDIAC CATHETERIZATION  Result Date: 10/12/2019  Previously placed Dist RCA drug eluting stent is widely patent.  1st Diag lesion is 95% stenosed.  Balloon angioplasty was performed using a BALLOON EMERGE MR 12/12/2019.  Post intervention, there is a 10% residual stenosis.  Prox LAD to Mid LAD lesion is 85% stenosed.  A drug-eluting stent was successfully placed using a STENT RESOLUTE  ONYX 3.0X38, postdilated to 3.5 mm and final kissing balloon inflation with a 2.25 mm balloon in the diagonal.  Post intervention, there is a 0% residual stenosis.  LV end diastolic pressure is normal.  There is no aortic valve stenosis.  EBU 3.75 from the right radial worked fairly well. Due to his anatomy, a longer than typical left guide is required.  Continue aggressive secondary prevention.  Continue dual antiplatelet therapy for 12 months.  After 12 months, would consider clopidogrel monotherapy given his aggressive disease.   ECHOCARDIOGRAM COMPLETE  Result Date: 10/11/2019    ECHOCARDIOGRAM REPORT   Patient Name:   Steven Mcneil Date of Exam: 10/11/2019 Medical Rec #:  12/11/2019  Height:       67.0 in Accession #:    [id number] Weight:       176.8 lb Date of Birth:  1960/09/22  BSA:          1.919 m Patient Age:    59 years   BP:           104/73 mmHg Patient Gender: M          HR:           92 bpm. Exam Location:  Inpatient Procedure: 2D Echo Indications:    acute myocardial infarction 410  History:        Patient has no prior history of Echocardiogram examinations.                 Signs/Symptoms:Chest Pain; Risk Factors:Diabetes, Hypertension                 and Family History of Coronary Artery Disease.  Sonographer:    03/30/1960 Referring Phys: [phone] PETER M [id number] IMPRESSIONS  1. Left ventricular ejection fraction, by estimation, is 50 to 55%. Left ventricular ejection fraction by 3D volume is 50 %. The left ventricle has low normal function. The left ventricle demonstrates global hypokinesis. Left ventricular diastolic parameters are consistent with Grade I diastolic dysfunction (impaired relaxation).  2. Right ventricular systolic function is normal. The right ventricular size is normal. Mildly increased right ventricular wall  thickness.  3. The mitral valve is grossly normal. Trivial mitral valve regurgitation.  4. The aortic valve is tricuspid. Aortic valve regurgitation is not  visualized.  5. The inferior vena cava is normal in size with greater than 50% respiratory variability, suggesting right atrial pressure of 3 mmHg. Comparison(s): No prior Echocardiogram. FINDINGS  Left Ventricle: Left ventricular ejection fraction, by estimation, is 50 to 55%. Left ventricular ejection fraction by 3D volume is 50 %. The left ventricle has low normal function. The left ventricle demonstrates global hypokinesis. The left ventricular internal cavity size was normal in size. There is no left ventricular hypertrophy. Left ventricular diastolic parameters are consistent with Grade I diastolic dysfunction (impaired relaxation). Right Ventricle: The right ventricular size is normal. Mildly increased right ventricular wall thickness. Right ventricular systolic function is normal. Left Atrium: Left atrial size was normal in size. Right Atrium: Right atrial size was normal in size. Pericardium: There is no evidence of pericardial effusion. Mitral Valve: The mitral valve is grossly normal. Trivial mitral valve regurgitation. Tricuspid Valve: The tricuspid valve is grossly normal. Tricuspid valve regurgitation is not demonstrated. Aortic Valve: The aortic valve is tricuspid. Aortic valve regurgitation is not visualized. Pulmonic Valve: The pulmonic valve was normal in structure. Pulmonic valve regurgitation is not visualized. Aorta: The aortic root and ascending aorta are structurally normal, with no evidence of dilitation. Venous: The inferior vena cava is normal in size with greater than 50% respiratory variability, suggesting right atrial pressure of 3 mmHg. IAS/Shunts: The atrial septum is grossly normal.  LEFT VENTRICLE PLAX 2D LVIDd:         4.70 cm         Diastology LVIDs:         3.30 cm         LV e' medial:  7.18 cm/s LV PW:         1.00 cm         LV e' lateral: 7.07 cm/s LV IVS:        0.90 cm LVOT diam:     2.20 cm LV SV:         58              3D Volume EF LV SV Index:   30              LV 3D  EF:    Left LVOT Area:     3.80 cm                     ventricular                                             ejection                                             fraction by                                             3D volume  is 50 %.                                 3D Volume EF:                                3D EF:        50 % RIGHT VENTRICLE             IVC RV S prime:     12.60 cm/s  IVC diam: 1.80 cm TAPSE (M-mode): 1.5 cm LEFT ATRIUM           Index       RIGHT ATRIUM           Index LA diam:      3.70 cm 1.93 cm/m  RA Area:     11.50 cm LA Vol (A4C): 41.5 ml 21.63 ml/m RA Volume:   23.30 ml  12.14 ml/m  AORTIC VALVE LVOT Vmax:   93.30 cm/s LVOT Vmean:  59.900 cm/s LVOT VTI:    0.153 m  AORTA Ao Root diam: 3.60 cm Ao Asc diam:  3.50 cm  SHUNTS Systemic VTI:  0.15 m Systemic Diam: 2.20 cm Riley Lam MD Electronically signed by Riley Lam MD Signature Date/Time: 10/11/2019/1:16:45 PM    Final     Cardiac Studies     Patient Profile     59 y.o. male with DM, HTN, HLD admitted 10/10/19 with an acute inferior STEMI secondary to an occluded distal RCA. He is s/p drug eluting stent placement in the distal RCA and drug eluting stent placement in the LAD.   Assessment & Plan    1. CAD/Inferior STEMI: Doing well this am. He has had some dizziness this am. Will transfer to telemetry today. Possible discharge home tomorrow. Continue ASA, Brilinta and statin. Will not start a beta blocker or Ace-inh/ARB given his soft blood pressure.   2. HTN: BP remains stable but low. Will not add additional therapy  3. HLD: continue high intensity statin.    Transfer to telemetry unit.   For questions or updates, please contact [facility] Please consult [email or web address] for contact info under        Signed, Verne Carrow, MD  10/13/2019, 8:00 AM

## 2019-10-13 NOTE — Progress Notes (Signed)
Pt coming out of bathroom when I entered room. C/o dizziness and SOB.  BP sitting  89/72 and HR to 108 with sats at 95% RA. Had pt lie in bed for MI education. Pt called wife so she could listen to education. Pt and wife did not want translator. Discussed MI restrictions, brilinta importance with stent, walking for ex, heart healthy and low carb food choices, CRP 2 and NTG use.  Referred to [place] program. Understanding voiced by pt and wife of ed. Left written materials also.  Notified cardiologist of low BPs.  Pt wanted to walk after ed. CARDIAC REHAB PHASE I   PRE:  Rate/Rhythm: 89 SR  BP:  Supine: 101/80  Sitting: 95/73  Standing: 94/78   SaO2: 96%RA  MODE:  Ambulation: 270 ft   POST:  Rate/Rhythm: 108ST    Stopped in hallway to rest  BP 98/75   BP:  Supine:   Sitting: 91/71  In room  Standing:    SaO2: 96%RA  pt walked 270 ft on RA with asst x 1. C/ SOB and stopped once to rest. BP taken at 98/75. Back to room after resting and to recliner with call bell.  [id number]   Luetta Nutting, RN BSN  10/13/2019 8:40 AM

## 2019-10-14 ENCOUNTER — Encounter (HOSPITAL_COMMUNITY): Payer: Self-pay | Admitting: Cardiology

## 2019-10-14 ENCOUNTER — Other Ambulatory Visit: Payer: Self-pay

## 2019-10-14 DIAGNOSIS — I2111 ST elevation (STEMI) myocardial infarction involving right coronary artery: Secondary | ICD-10-CM | POA: Diagnosis not present

## 2019-10-14 LAB — CBC
HCT: 42.2 % (ref 39.0–52.0)
Hemoglobin: 14.2 g/dL (ref 13.0–17.0)
MCH: 31.3 pg (ref 26.0–34.0)
MCHC: 33.6 g/dL (ref 30.0–36.0)
MCV: 93.2 fL (ref 80.0–100.0)
Platelets: 145 10*3/uL — ABNORMAL LOW (ref 150–400)
RBC: 4.53 MIL/uL (ref 4.22–5.81)
RDW: 11.8 % (ref 11.5–15.5)
WBC: 13.6 10*3/uL — ABNORMAL HIGH (ref 4.0–10.5)
nRBC: 0 % (ref 0.0–0.2)

## 2019-10-14 LAB — GLUCOSE, CAPILLARY
Glucose-Capillary: 158 mg/dL — ABNORMAL HIGH (ref 70–99)
Glucose-Capillary: 134 mg/dL — ABNORMAL HIGH (ref 70–99)
Glucose-Capillary: 127 mg/dL — ABNORMAL HIGH (ref 70–99)
Glucose-Capillary: 133 mg/dL — ABNORMAL HIGH (ref 70–99)
Glucose-Capillary: 229 mg/dL — ABNORMAL HIGH (ref 70–99)

## 2019-10-14 MED ORDER — MIDODRINE HCL 5 MG PO TABS
2.5000 mg | ORAL_TABLET | Freq: Three times a day (TID) | ORAL | Status: DC
  Administered 2019-10-14 – 2019-10-16 (×4): 2.5 mg via ORAL
  Filled 2019-10-14 (×5): qty 1

## 2019-10-14 MED ORDER — SODIUM CHLORIDE 0.9 % IV SOLN: INTRAVENOUS | Status: AC

## 2019-10-14 NOTE — TOC Benefit Eligibility Note (Signed)
Transition of Care [facility]) Benefit Eligibility Note    Patient Details  Name: Adarsh Mundorf MRN: [id number] Date of Birth: 1960/05/17   Medication/Dose: Marden Noble 90mg .. bid for 30 day supply  Covered?: Yes  Tier: 3 Drug  Prescription Coverage Preferred Pharmacy: [facility] with Person/Company/Phone Number:: [phone]. [facility] [phone]  Co-Pay: $20.00  Prior Approval: No  Deductible:  (No Deductible on this plan)       YM#[phone] Phone Number: 10/14/2019, 1:46 PM

## 2019-10-14 NOTE — Progress Notes (Signed)
Patient report left arm felt weak when he raised it  and "drops". Report no pain. Full movement in BLE and RUE. NP Su Hilt made aware

## 2019-10-14 NOTE — Progress Notes (Signed)
Progress Note  Patient Name: Steven Mcneil Date of Encounter: 10/14/2019  [facility] Cardiologist: [place]  Subjective   No chest pain or dyspnea this am. Still hypotensive and dizzy with standing.   Inpatient Medications    Scheduled Meds: . aspirin  81 mg Oral Daily  . atorvastatin  80 mg Oral Daily  . Chlorhexidine Gluconate Cloth  6 each Topical Daily  . influenza vac split quadrivalent PF  0.5 mL Intramuscular Tomorrow-1000  . insulin aspart  0-15 Units Subcutaneous TID WC  . melatonin  3 mg Oral QHS  . pantoprazole  40 mg Oral Daily  . sodium chloride flush  3 mL Intravenous Q12H  . ticagrelor  90 mg Oral BID   Continuous Infusions: . sodium chloride     PRN Meds: sodium chloride, acetaminophen, nitroGLYCERIN, ondansetron (ZOFRAN) IV, sodium chloride flush   Vital Signs    Vitals:   10/14/19 0600 10/14/19 0630 10/14/19 0700 10/14/19 0810  BP: (!) 80/62 92/61 91/68    Pulse:      Resp: 19 14 17    Temp:    98.9 F (37.2 C)  TempSrc:    Oral  SpO2: 96% 94% 95%   Weight:      Height:        Intake/Output Summary (Last 24 hours) at 10/14/2019 0820 Last data filed at 10/14/2019 0400 Gross per 24 hour  Intake 600 ml  Output 2025 ml  Net -1425 ml   Last 3 Weights 10/10/2019 10/17/2012 10/02/2012  Weight (lbs) 176 lb 12.9 oz 178 lb 179 lb  Weight (kg) 80.2 kg 80.74 kg 81.194 kg      Telemetry    NSR - Personally Reviewed  ECG    No AM EKG  Physical Exam   General: Well developed, well nourished, NAD  HEENT: OP clear, mucus membranes moist  SKIN: warm, dry. No rashes. Neuro: No focal deficits  Musculoskeletal: Muscle strength 5/5 all ext  Psychiatric: Mood and affect normal  Neck: No JVD, no carotid bruits, no thyromegaly, no lymphadenopathy.  Lungs:Clear bilaterally, no wheezes, rhonci, crackles Cardiovascular: Regular rate and rhythm. No murmurs, gallops or rubs. Abdomen:Soft. Bowel sounds present. Non-tender.  Extremities: No lower  extremity edema. Pulses are 2 + in the bilateral DP/PT.   Labs    High Sensitivity Troponin:   Recent Labs  Lab 10/10/19 1515 10/10/19 1625  TROPONINIHS >27,000* >27,000*      Chemistry Recent Labs  Lab 10/10/19 1515 10/10/19 1515 10/11/19 0314 10/12/19 0620 10/13/19 0146  NA 137   < > 134* 138 137  K 3.7   < > 3.4* 3.7 3.9  CL 104   < > 102 104 104  CO2 21*   < > 21* 24 21*  GLUCOSE 180*   < > 167* 145* 136*  BUN 11   < > 9 7 8   CREATININE 0.93   < > 0.72 0.78 0.76  CALCIUM 8.7*   < > 8.6* 8.8* 9.2  PROT 6.1*  --   --   --   --   ALBUMIN 3.6  --   --   --   --   AST 283*  --   --   --   --   ALT 55*  --   --   --   --   ALKPHOS 54  --   --   --   --   BILITOT 0.8  --   --   --   --  GFRNONAA >60   < > >60 >60 >60  ANIONGAP 12   < > 11 10 12    < > = values in this interval not displayed.     Hematology Recent Labs  Lab 10/12/19 0303 10/13/19 0146 10/14/19 0137  WBC 13.9* 13.8* 13.6*  RBC 4.56 4.78 4.53  HGB 14.2 14.9 14.2  HCT 42.2 44.1 42.2  MCV 92.5 92.3 93.2  MCH 31.1 31.2 31.3  MCHC 33.6 33.8 33.6  RDW 11.9 11.8 11.8  PLT 141* 138* 145*    BNPNo results for input(s): BNP, PROBNP in the last 168 hours.   DDimer No results for input(s): DDIMER in the last 168 hours.   Radiology    CARDIAC CATHETERIZATION  Result Date: 10/12/2019  Previously placed Dist RCA drug eluting stent is widely patent.  1st Diag lesion is 95% stenosed.  Balloon angioplasty was performed using a BALLOON EMERGE MR 12/12/2019.  Post intervention, there is a 10% residual stenosis.  Prox LAD to Mid LAD lesion is 85% stenosed.  A drug-eluting stent was successfully placed using a STENT RESOLUTE ONYX 3.0X38, postdilated to 3.5 mm and final kissing balloon inflation with a 2.25 mm balloon in the diagonal.  Post intervention, there is a 0% residual stenosis.  LV end diastolic pressure is normal.  There is no aortic valve stenosis.  EBU 3.75 from the right radial worked fairly  well. Due to his anatomy, a longer than typical left guide is required.  Continue aggressive secondary prevention.  Continue dual antiplatelet therapy for 12 months.  After 12 months, would consider clopidogrel monotherapy given his aggressive disease.    Cardiac Studies   Echo 10/11/19:  1. Left ventricular ejection fraction, by estimation, is 50 to 55%. Left  ventricular ejection fraction by 3D volume is 50 %. The left ventricle has  low normal function. The left ventricle demonstrates global hypokinesis.  Left ventricular diastolic  parameters are consistent with Grade I diastolic dysfunction (impaired  relaxation).  2. Right ventricular systolic function is normal. The right ventricular  size is normal. Mildly increased right ventricular wall thickness.  3. The mitral valve is grossly normal. Trivial mitral valve  regurgitation.  4. The aortic valve is tricuspid. Aortic valve regurgitation is not  visualized.  5. The inferior vena cava is normal in size with greater than 50%  respiratory variability, suggesting right atrial pressure of 3 mmHg.   Comparison(s): No prior Echocardiogram.   Patient Profile     59 y.o. male with DM, HTN, HLD admitted 10/10/19 with an acute inferior STEMI secondary to an occluded distal RCA. He is s/p drug eluting stent placement in the distal RCA and drug eluting stent placement in the LAD.   Assessment & Plan    1. CAD/Inferior STEMI: Still dizzy this am and hypotensive. Will hydrate aggressively today. Will continue ASA, Brilinta and statin. Will not start a beta blocker or Ace-inh/ARB given his soft blood pressure.   2. HTN: BP remains soft on no anti-hypertensive therapy  3. HLD: continue high intensity statin.    Orders in for transfer to telemetry since yesterday. Will not plan d/c home today given ongoing hypotension and dizziness post MI.     For questions or updates, please contact [facility] Please consult [email or web address] for  contact info under        Signed, 12/10/19, MD  10/14/2019, 8:20 AM

## 2019-10-14 NOTE — Progress Notes (Signed)
Pt transferred to 6E19 with no complications. VSS. Lunch tray sent with patient, escorted by RN. Pt attempted to call spouse to update with new room, declined RN to call at this time. Vernona Rieger RN 6E assumed care at bedside. All belongings sent with patient including glasses, toothpicks, tablet and cell phone, clothing. Pt has brilinta card in belongings bag.

## 2019-10-14 NOTE — Progress Notes (Signed)
CARDIAC REHAB PHASE I   PRE:  Rate/Rhythm: 94 SR  BP:  Supine: 79/58  Sitting:   Standing: 80/70   SaO2: 95%  MODE:  Ambulation: 0 ft   POST:  Rate/Rhythm: 106 ST 0800-0810 Came to see pt to walk. Pt stated he was feeling better and had been up in room some. Took BPs as documented. Pt dizzy standing at 80/70.  Had pt return to lying down. Will continue to follow and hold ambulation at this time.        Luetta Nutting, RN BSN  10/14/2019 8:04 AM

## 2019-10-14 NOTE — Care Management (Addendum)
Case management placed a benefit's check for Brilinta 90 mg po BID with TOC for cost to the patient.  Also, phyician sticky note left as a reminder to please give patient a prescription for the CBG machine at the patient's pharmacy to pick up for discharge.

## 2019-10-14 NOTE — TOC Initial Note (Signed)
Transition of Care [facility]) - Initial/Assessment Note    Patient Details  Name: Steven Mcneil MRN: [id number] Date of Birth: October 29, 1960  Transition of Care [facility]) CM/SW Contact:    Curlene Labrum, RN Phone Number: 10/14/2019, 10:59 AM  Clinical Narrative:                 Case management met with the patient at the bedside to discuss transitions of care.  The patient lives with his wife at home.  I spoke with the patient and gave him a [place] for medication assistance through his pharmacy.  The patient and wife were updated on the importance of tracking his blood sugars at home and are aware that a prescription will be called in to the pharmacy to pick up a CBG machine upon discharge from the hospital.  Will continue to follow the patient for discharge needs for home.  Expected Discharge Plan: Home/Self Care Barriers to Discharge: No Barriers Identified   Patient Goals and CMS Choice Patient states their goals for this hospitalization and ongoing recovery are:: Patient plans to discharge home with wife. CMS Medicare.gov Compare Post Acute Care list provided to:: Patient Choice offered to / list presented to : Patient  Expected Discharge Plan and Services Expected Discharge Plan: Home/Self Care In-house Referral: PCP / [facility] Connect Discharge Planning Services: CM Consult Post Acute Care Choice: NA Living arrangements for the past 2 months: Single Family Home                                      Prior Living Arrangements/Services Living arrangements for the past 2 months: Single Family Home Lives with:: Spouse Patient language and need for interpreter reviewed:: Yes Do you feel safe going back to the place where you live?: Yes      Need for Family Participation in Patient Care: Yes (Comment) Care giver support system in place?: Yes (comment)   Criminal Activity/Legal Involvement Pertinent to Current Situation/Hospitalization: No - Comment as  needed  Activities of Daily Living      Permission Sought/Granted Permission sought to share information with : Case Manager          Permission granted to share info w Relationship: Patient's wife - Tuyet     Emotional Assessment Appearance:: Appears stated age Attitude/Demeanor/Rapport: Engaged Affect (typically observed): Accepting Orientation: : Oriented to Self, Oriented to Place, Oriented to  Time, Oriented to Situation Alcohol / Substance Use: Never Used Psych Involvement: No (comment)  Admission diagnosis:  STEMI involving right coronary artery ([place]) [I21.11] Patient Active Problem List   Diagnosis Date Noted  . Coronary artery disease involving native coronary artery of native heart with unstable angina pectoris ([place])   . STEMI involving right coronary artery ([place]) 10/10/2019  . Type 2 diabetes mellitus without complication, without long-term current use of insulin ([place]) 10/10/2019  . Chest pain 09/26/2012  . Essential hypertension 09/26/2012  . Family history of heart disease 09/26/2012   PCP:  Lawerance Cruel, MD Pharmacy:   [facility] [address], [address] [place] [address] Phone: [phone] Fax: [phone]     Social Determinants of Health (SDOH) Interventions    Readmission Risk Interventions Readmission Risk Prevention Plan 10/14/2019  Post Dischage Appt Complete  Medication Screening Complete  Transportation Screening Complete  Some recent data  might be hidden

## 2019-10-15 ENCOUNTER — Inpatient Hospital Stay (HOSPITAL_COMMUNITY): Payer: BLUE CROSS/BLUE SHIELD

## 2019-10-15 DIAGNOSIS — R29898 Other symptoms and signs involving the musculoskeletal system: Secondary | ICD-10-CM

## 2019-10-15 DIAGNOSIS — I959 Hypotension, unspecified: Secondary | ICD-10-CM

## 2019-10-15 DIAGNOSIS — I2111 ST elevation (STEMI) myocardial infarction involving right coronary artery: Secondary | ICD-10-CM | POA: Diagnosis not present

## 2019-10-15 LAB — CBC
HCT: 41.9 % (ref 39.0–52.0)
Hemoglobin: 14.2 g/dL (ref 13.0–17.0)
MCH: 31.1 pg (ref 26.0–34.0)
MCHC: 33.9 g/dL (ref 30.0–36.0)
MCV: 91.9 fL (ref 80.0–100.0)
Platelets: 152 10*3/uL (ref 150–400)
RBC: 4.56 MIL/uL (ref 4.22–5.81)
RDW: 11.5 % (ref 11.5–15.5)
WBC: 11.3 10*3/uL — ABNORMAL HIGH (ref 4.0–10.5)
nRBC: 0 % (ref 0.0–0.2)

## 2019-10-15 LAB — BASIC METABOLIC PANEL
Anion gap: 12 (ref 5–15)
BUN: 10 mg/dL (ref 6–20)
CO2: 21 mmol/L — ABNORMAL LOW (ref 22–32)
Calcium: 9 mg/dL (ref 8.9–10.3)
Chloride: 104 mmol/L (ref 98–111)
Creatinine, Ser: 0.91 mg/dL (ref 0.61–1.24)
GFR, Estimated: >60 mL/min (ref >60)
Glucose, Bld: 117 mg/dL — ABNORMAL HIGH (ref 70–99)
Potassium: 3.9 mmol/L (ref 3.5–5.1)
Sodium: 137 mmol/L (ref 135–145)

## 2019-10-15 LAB — GLUCOSE, CAPILLARY
Glucose-Capillary: 163 mg/dL — ABNORMAL HIGH (ref 70–99)
Glucose-Capillary: 162 mg/dL — ABNORMAL HIGH (ref 70–99)
Glucose-Capillary: 154 mg/dL — ABNORMAL HIGH (ref 70–99)
Glucose-Capillary: 128 mg/dL — ABNORMAL HIGH (ref 70–99)

## 2019-10-15 LAB — LDL CHOLESTEROL, DIRECT: Direct LDL: 76.5 mg/dL (ref 0–99)

## 2019-10-15 MED ORDER — IOHEXOL 350 MG/ML SOLN
80.0000 mL | Freq: Once | INTRAVENOUS | Status: AC | PRN
  Administered 2019-10-15: 80 mL via INTRAVENOUS

## 2019-10-15 NOTE — Code Documentation (Signed)
Stroke Response Nurse Documentation Code Documentation  Steven Mcneil is a 59 y.o. male admitted on 6E19 with acute LUE weakness and dizziness. Patient presented on 10/10/2019 from work with STEMI who is post PCI with f/u stent placement on 10/12/2019. Per patient he was attempting to lift something at 1300 on 10/14/2019 and had sudden weakness in LUE with dizziness, symptoms lasted 5 minutes and then improved. Per patient LUE still feels weaker than baseline. CBG 163 at 0758. PMH includes CAD, HTN, STEMI, DM, and HLD, patient was taking 81mg  ASA PTA and is now also on Brilinta 90mg  BID. Neurologist at bedside on SRN arrival, NIH 0 on exam with ongoing weakness in LUE without drift. Patient transported to CT for CT Head, and CTa Head/Neck. Code stroke cancelled per Dr. for no acute abnormality on imaging, f/u MRI ordered. No additional neuro checks ordered, will cancel code stroke.   Stroke Response RN [phone]

## 2019-10-15 NOTE — Progress Notes (Addendum)
Progress Note  Patient Name: Steven Mcneil Date of Encounter: 10/15/2019  [facility] Cardiologist: Peter [place], MD   Subjective   No chest pain. Mild intermittent dyspnea. Noted LUE since yesterday afternoon but did not reported to nurse until shift change yesterday evening.   Inpatient Medications    Scheduled Meds: . aspirin  81 mg Oral Daily  . atorvastatin  80 mg Oral Daily  . Chlorhexidine Gluconate Cloth  6 each Topical Daily  . influenza vac split quadrivalent PF  0.5 mL Intramuscular Tomorrow-1000  . insulin aspart  0-15 Units Subcutaneous TID WC  . melatonin  3 mg Oral QHS  . midodrine  2.5 mg Oral TID WC  . pantoprazole  40 mg Oral Daily  . sodium chloride flush  3 mL Intravenous Q12H  . ticagrelor  90 mg Oral BID   Continuous Infusions: . sodium chloride     PRN Meds: sodium chloride, acetaminophen, nitroGLYCERIN, ondansetron (ZOFRAN) IV, sodium chloride flush   Vital Signs    Vitals:   10/14/19 2140 10/15/19 0100 10/15/19 0439 10/15/19 0750  BP:  (!) 88/70 93/62   Pulse:  82 78   Resp:  16 17   Temp:  98.6 F (37 C) 99.1 F (37.3 C) 99.1 F (37.3 C)  TempSrc:  Oral Oral Oral  SpO2: 96% 95% 93%   Weight:   76.5 kg   Height:        Intake/Output Summary (Last 24 hours) at 10/15/2019 0814 Last data filed at 10/14/2019 1000 Gross per 24 hour  Intake 277.96 ml  Output --  Net 277.96 ml   Last 3 Weights 10/15/2019 10/10/2019 10/17/2012  Weight (lbs) 168 lb 11.2 oz 176 lb 12.9 oz 178 lb  Weight (kg) 76.522 kg 80.2 kg 80.74 kg      Telemetry    SR - Personally Reviewed  ECG    NSR - Personally Reviewed  Physical Exam   GEN: No acute distress.   Neck: No JVD Cardiac: RRR, no murmurs, rubs, or gallops. R forearm swelling  Respiratory: Diminished breath sound GI: Soft, nontender, non-distended  MS: No edema; Neuro:  Nonfocal, LUE weakness, ? Facial droop  Psych: Normal affect   Labs    High Sensitivity Troponin:   Recent Labs   Lab 10/10/19 1515 10/10/19 1625  TROPONINIHS >27,000* >27,000*      Chemistry Recent Labs  Lab 10/10/19 1515 10/11/19 0314 10/12/19 0620 10/13/19 0146 10/15/19 0116  NA 137   < > 138 137 137  K 3.7   < > 3.7 3.9 3.9  CL 104   < > 104 104 104  CO2 21*   < > 24 21* 21*  GLUCOSE 180*   < > 145* 136* 117*  BUN 11   < > 7 8 10   CREATININE 0.93   < > 0.78 0.76 0.91  CALCIUM 8.7*   < > 8.8* 9.2 9.0  PROT 6.1*  --   --   --   --   ALBUMIN 3.6  --   --   --   --   AST 283*  --   --   --   --   ALT 55*  --   --   --   --   ALKPHOS 54  --   --   --   --   BILITOT 0.8  --   --   --   --   GFRNONAA >60   < > >60 >  60 >60  ANIONGAP 12   < > 10 12 12    < > = values in this interval not displayed.     Hematology Recent Labs  Lab 10/13/19 0146 10/14/19 0137 10/15/19 0116  WBC 13.8* 13.6* 11.3*  RBC 4.78 4.53 4.56  HGB 14.9 14.2 14.2  HCT 44.1 42.2 41.9  MCV 92.3 93.2 91.9  MCH 31.2 31.3 31.1  MCHC 33.8 33.6 33.9  RDW 11.8 11.8 11.5  PLT 138* 145* 152    Radiology    No results found.  Cardiac Studies   CORONARY STENT INTERVENTION 10/12/2019  Intravascular Ultrasound/IVUS  LEFT HEART CATH AND CORONARY ANGIOGRAPHY  Conclusion    Previously placed Dist RCA drug eluting stent is widely patent.  1st Diag lesion is 95% stenosed.  Balloon angioplasty was performed using a BALLOON EMERGE MR 12/12/2019.  Post intervention, there is a 10% residual stenosis.  Prox LAD to Mid LAD lesion is 85% stenosed.  A drug-eluting stent was successfully placed using a STENT RESOLUTE ONYX 3.0X38, postdilated to 3.5 mm and final kissing balloon inflation with a 2.25 mm balloon in the diagonal.  Post intervention, there is a 0% residual stenosis.  LV end diastolic pressure is normal.  There is no aortic valve stenosis.  EBU 3.75 from the right radial worked fairly well. Due to his anatomy, a longer than typical left guide is required.   Continue aggressive secondary prevention.   Continue dual antiplatelet therapy for 12 months.  After 12 months, would consider clopidogrel monotherapy given his aggressive disease.  Echo 10/11/19 1. Left ventricular ejection fraction, by estimation, is 50 to 55%. Left  ventricular ejection fraction by 3D volume is 50 %. The left ventricle has  low normal function. The left ventricle demonstrates global hypokinesis.  Left ventricular diastolic  parameters are consistent with Grade I diastolic dysfunction (impaired  relaxation).  2. Right ventricular systolic function is normal. The right ventricular  size is normal. Mildly increased right ventricular wall thickness.  3. The mitral valve is grossly normal. Trivial mitral valve  regurgitation.  4. The aortic valve is tricuspid. Aortic valve regurgitation is not  visualized.  5. The inferior vena cava is normal in size with greater than 50%  respiratory variability, suggesting right atrial pressure of 3 mmHg.    Coronary/Graft Acute MI Revascularization 10/10/2019  CORONARY STENT INTERVENTION  LEFT HEART CATH AND CORONARY ANGIOGRAPHY  Conclusion    RV Branch lesion is 75% stenosed.  Dist RCA lesion is 100% stenosed.  1st Mrg lesion is 30% stenosed.  Prox LAD lesion is 85% stenosed.  Mid LAD lesion is 50% stenosed.  1st Diag lesion is 95% stenosed.  RPAV lesion is 95% stenosed.  Post intervention, there is a 0% residual stenosis.  A drug-eluting stent was successfully placed using a STENT RESOLUTE ONYX 2.5X30.  Post intervention, there is a 0% residual stenosis.  A stent was successfully placed.  There is mild left ventricular systolic dysfunction.  LV end diastolic pressure is mildly elevated.  The left ventricular ejection fraction is 45-50% by visual estimate.   1. Severe 2 vessel obstructive CAD.    - 100% distal RCA occlusion- this is the culprit    - 85% proximal LAD bifurcation stenosis involving the first diagonal which is 95% stenosis. [place]  class 1,1,1. 2. Mild LV dysfunction 3. Mildly elevated LVEDP 20 mm Hg 4. Successful PCI of the distal RCA extending into a large PL branch with DES x 1.  Plan: DAPT  for one year. Anticipate return in 2 days for complex bifurcation PCI of the LAD/diagonal. Risk factor modification.     Patient Profile     59 y.o. male with DM, HTN, HLD admitted 10/10/19 with an acute inferior STEMI secondary to an occluded distal RCA. He is s/p drug eluting stent placement in the distal RCA and drug eluting stent placement in the LAD.   Assessment & Plan    1. Inferior STEMI - troponin > 27,000. Cath as above. S/p successful PCI of the distal RCA extending into a large PL branch with DES x 1. Underwent IVUS guided staged PCI of LAD and angioplasty only to 1st diag. Echo with low normal LVEF at 50-55% and grade 1 DD.  - No recurrent chest pain - Continue ASA, Brillinta, and statin  - No BB or ACE due to hypotension - Has some swelling in R forearm>> will review with MD - Mild intermittent dyspnea likely due to Brillinta, follow closely   2. Dizziness / LUE weakness - Seems dizziness started on 10/12.  - Now reported LUE yesterday evening but symptoms started in afternoon - Discussed with neuro who initiated stroke work up  3. Hypotension  - Persistent despite aggressive hydration and midodrine  4. HLD - 10/11/2019: Cholesterol 167; HDL 38; LDL Cholesterol 89; Triglycerides 198; VLDL 40  - Continue statin   For questions or updates, please contact [facility] Please consult [email or web address] for contact info under        Signed, Manson Passey, PA  10/15/2019, 8:14 AM    I have personally seen and examined this patient. I agree with the assessment and plan as outlined above.  Remains weak today with left upper extremity weakness today. Appreciate Neuro consult team. Planning for brain MRI. No stroke on CT evidence of vessel occlusion on CTA head.  LV systolic function is preserved. No  plans for further coronary intervention.  Will continue ASA and Brilinta. Continue statin. No beta blocker or Ace-inh/ARB due to hypotension.  Will continue IV fluids today.  Verne Carrow 10/15/2019 9:57 AM

## 2019-10-15 NOTE — Progress Notes (Signed)
CARDIAC REHAB PHASE I   PRE:  Rate/Rhythm: 94 SR  BP:  Supine: 93/55  Sitting: 96/67  Standing: 94/76   SaO2: 97%RA  MODE:  Ambulation: 470 ft   POST:  Rate/Rhythm: 99 SR  BP:  Supine:   Sitting: 95/74  Standing:    SaO2: 96%RA 1441-1501 Pt walked 470 ft on RA with steady gait and minimal asst. No dizziness during walk. Did c/o feeling palpitations at 99 SR and a little dizziness after walk and sitting in chair. Had pt lie back down after walk but he tolerated well. No c/o weakness.    Luetta Nutting, RN BSN  10/15/2019 2:58 PM

## 2019-10-15 NOTE — Progress Notes (Signed)
Brief Neuro update:  No stroke on MRI Brain without contrast. I think the episode of LUE weakness maybe a part of his Lightheadedness. No further workup at this time.  Neurology inpatient team will signoff. Please feel free to contact us with any questions or concerns.  Erick Blinks [facility] Pager Number [phone]

## 2019-10-15 NOTE — Consult Note (Addendum)
NEUROLOGY CONSULTATION NOTE   Date of service: October 15, 2019 Patient Name: Steven Mcneil MRN:  [id number] DOB:  27-May-1960 Reason for consult: "Stroke code"  History of Present Illness  Steven Mcneil is a 59 y.o. male with hx of DM2, HTN, HLD, admitted with acute inferior STEMI and s/p PCI with DES in distal RCA and DES in LAD.  At 1PM on 10/14/19, he was standing up in the bathroom when he felt dizzy, he couldnot get his LUE above his shoulders to pick the towels. Feels that this significantly improved after just 5 mins but still feels that the strength in the LUE is not quite back to normal.  Of note, he has been persistently hypotensive throughout this admission. Was placed on fluids for 12 hours yesterday and started on midodrine.  NIHSS: 0(has some mild LUE weakness that did not even register on NIHSS) Premorbid mRS: 0 TPA: outside window Thrombectomy: No LVO.  ROS   Constitutional Denies weight loss, fever and chills.  HEENT Denies changes in vision and hearing.  Respiratory Denies SOB and cough.  CV Denies palpitations and CP  GI Denies abdominal pain, nausea, vomiting and diarrhea.  GU Denies dysuria and urinary frequency.  MSK Denies myalgia and joint pain.  Skin Denies rash and pruritus.  Neurological Denies headache, does endorse presyncope.  Psychiatric Denies recent changes in mood. Denies anxiety and depression.   Past History   Past Medical History:  Diagnosis Date  . Chest pain   . Coronary artery disease   . Family history of heart disease   . Hypertension    Past Surgical History:  Procedure Laterality Date  . CORONARY STENT INTERVENTION N/A 10/10/2019   Procedure: CORONARY STENT INTERVENTION;  Surgeon: Swaziland, Peter M, MD;  Location: [facility] INVASIVE CV LAB;  Service: Cardiovascular;  Laterality: N/A;  . CORONARY STENT INTERVENTION N/A 10/12/2019   Procedure: CORONARY STENT INTERVENTION;  Surgeon: Corky Crafts, MD;  Location: [facility] INVASIVE CV LAB;   Service: Cardiovascular;  Laterality: N/A;  . CORONARY/GRAFT ACUTE MI REVASCULARIZATION N/A 10/10/2019   Procedure: Coronary/Graft Acute MI Revascularization;  Surgeon: Swaziland, Peter M, MD;  Location: [facility] INVASIVE CV LAB;  Service: Cardiovascular;  Laterality: N/A;  . INTRAVASCULAR ULTRASOUND/IVUS N/A 10/12/2019   Procedure: Intravascular Ultrasound/IVUS;  Surgeon: Corky Crafts, MD;  Location: [facility] INVASIVE CV LAB;  Service: Cardiovascular;  Laterality: N/A;  . LEFT HEART CATH AND CORONARY ANGIOGRAPHY N/A 10/10/2019   Procedure: LEFT HEART CATH AND CORONARY ANGIOGRAPHY;  Surgeon: Swaziland, Peter M, MD;  Location: [facility] INVASIVE CV LAB;  Service: Cardiovascular;  Laterality: N/A;  . LEFT HEART CATH AND CORONARY ANGIOGRAPHY N/A 10/12/2019   Procedure: LEFT HEART CATH AND CORONARY ANGIOGRAPHY;  Surgeon: Corky Crafts, MD;  Location: [facility] INVASIVE CV LAB;  Service: Cardiovascular;  Laterality: N/A;   Family History  Problem Relation Age of Onset  . Heart attack Father   . Heart attack Brother    Social History   Socioeconomic History  . Marital status: Married    Spouse name: Not on file  . Number of children: Not on file  . Years of education: Not on file  . Highest education level: Not on file  Occupational History  . Not on file  Tobacco Use  . Smoking status: Never Smoker  . Smokeless tobacco: Never Used  Vaping Use  . Vaping Use: Never used  Substance and Sexual Activity  . Alcohol use: No  . Drug use: Never  . Sexual activity: Not  on file  Other Topics Concern  . Not on file  Social History Narrative  . Not on file   Social Determinants of Health   Financial Resource Strain:   . Difficulty of Paying Living Expenses: Not on file  Food Insecurity:   . Worried About Programme researcher, broadcasting/film/video in the Last Year: Not on file  . Ran Out of Food in the Last Year: Not on file  Transportation Needs:   . Lack of Transportation (Medical): Not on file  . Lack of Transportation  (Non-Medical): Not on file  Physical Activity:   . Days of Exercise per Week: Not on file  . Minutes of Exercise per Session: Not on file  Stress:   . Feeling of Stress : Not on file  Social Connections:   . Frequency of Communication with Friends and Family: Not on file  . Frequency of Social Gatherings with Friends and Family: Not on file  . Attends Religious Services: Not on file  . Active Member of Clubs or Organizations: Not on file  . Attends Banker Meetings: Not on file  . Marital Status: Not on file   No Known Allergies  Medications   Medications Prior to Admission  Medication Sig Dispense Refill Last Dose  . aspirin 81 MG tablet Take 81 mg by mouth daily.   10/10/2019 at Unknown time  . metFORMIN (GLUCOPHAGE-XR) 500 MG 24 hr tablet Take 500 mg by mouth every evening.    10/10/2019 at Unknown time  . omeprazole (PRILOSEC) 20 MG capsule Take 20 mg by mouth daily.   10/09/2019  . lisinopril (PRINIVIL,ZESTRIL) 5 MG tablet Take 1 tablet (5 mg total) by mouth daily. (Patient not taking: Reported on 10/11/2019) 30 tablet 6 Not Taking at Unknown time     Vitals   Vitals:   10/14/19 2140 10/15/19 0100 10/15/19 0439 10/15/19 0750  BP:  (!) 88/70 93/62   Pulse:  82 78   Resp:  16 17   Temp:  98.6 F (37 C) 99.1 F (37.3 C) 99.1 F (37.3 C)  TempSrc:  Oral Oral Oral  SpO2: 96% 95% 93%   Weight:   76.5 kg   Height:         Body mass index is 26.42 kg/m.  Physical Exam   General: Laying comfortably in bed; appears a little frightened. HENT: Normal oropharynx and mucosa. Normal external appearance of ears and nose. Neck: Supple, no pain or tenderness CV: No JVD. No peripheral edema. Pulmonary: Symmetric Chest rise. Normal respiratory effort. Abdomen: Soft to touch, non-tender. Ext: No cyanosis, R forearm swelling, no deformity Skin: No rash. Normal palpation of skin.  Musculoskeletal: Normal digits and nails by inspection. No clubbing.  Neurologic  Examination  Mental status/Cognition: Alert, oriented to self, place, month and year, good attention. Speech/language: Fluent, does not think that his speech is slurred, comprehension intact, object naming intact, repetition intact. Cranial nerves:   CN II Pupils equal and reactive to light, no VF deficits   CN III,IV,VI EOM intact, no gaze preference or deviation, no nystagmus   CN V normal sensation in V1, V2, and V3 segments bilaterally   CN VII no asymmetry, no nasolabial fold flattening   CN VIII normal hearing to speech   CN IX & X normal palatal elevation, no uvular deviation   CN XI 5/5 head turn and 5/5 shoulder shrug bilaterally   CN XII midline tongue protrusion   Motor:  Muscle bulk: normal, tone normal,  pronator drift none tremor none.  He has significant pain and tenderness in his R forearm and his R forearm is appears a little swollen compared to the left. No arm or hand tenderness.  Mvmt Root Nerve  Muscle Right Left Comments  SA C5/6 Ax Deltoid 5 5   EF C5/6 Mc Biceps 4+ 5   EE C6/7/8 Rad Triceps 4+ 4+   WF C6/7 Med FCR 4+ 5   WE C7/8 PIN ECU 4+ 5   F Ab C8/T1 U ADM/FDI 4+ 4+   HF L1/2/3 Fem Illopsoas 5 5   KE L2/3/4 Fem Quad 5 5   DF L4/5 D Peron Tib Ant 5 5   PF S1/2 Tibial Grc/Sol 5 5    Reflexes:  Right Left Comments  Pectoralis 1 1    Biceps (C5/6) 1 1   Brachioradialis (C5/6) 1 1    Triceps (C6/7) 1 1    Patellar (L3/4) 1 1    Achilles (S1)      Hoffman      Plantar     Jaw jerk    Sensation:  Light touch Intact throughout   Pin prick Intact throughout   Temperature    Vibration   Proprioception    Coordination/Complex Motor:  - Finger to Nose intact BL - Rapid alternating movement are normal  Labs   CBC:  Recent Labs  Lab 10/10/19 1515 10/11/19 0314 10/14/19 0137 10/15/19 0116  WBC 12.2*   < > 13.6* 11.3*  NEUTROABS 8.8*  --   --   --   HGB 14.8   < > 14.2 14.2  HCT 42.6   < > 42.2 41.9  MCV 92.4   < > 93.2 91.9  PLT 142*   <  > 145* 152   < > = values in this interval not displayed.    Basic Metabolic Panel:  Lab Results  Component Value Date   NA 137 10/15/2019   K 3.9 10/15/2019   CO2 21 (L) 10/15/2019   GLUCOSE 117 (H) 10/15/2019   BUN 10 10/15/2019   CREATININE 0.91 10/15/2019   CALCIUM 9.0 10/15/2019   GFRNONAA >60 10/15/2019   GFRAA >60 09/12/2010   Lipid Panel:  Lab Results  Component Value Date   LDLCALC 89 10/11/2019   HgbA1c:  Lab Results  Component Value Date   HGBA1C 7.4 (H) 10/10/2019   Urine Drug Screen: No results found for: LABOPIA, COCAINSCRNUR, LABBENZ, AMPHETMU, THCU, LABBARB  Alcohol Level No results found for: ETH  Impression   Steven Mcneil is a 59 y.o. male with PMH significant for DM2, HTN, HLD, admitted with acute inferior STEMI and s/p PCI with DES in distal RCA and DES in LAD. We evaluated him for acute onset LUE weakness with significant improvement but not quite back to normal. The weakness was very mild and did not even register on the NIHSS. His neurologic examination is notable for R forearm tenderness with no redness, may be due to PCI but no concern for weakness out of proportion to pain in RUE. He does seem to have weak L hand grip. Workup with CTH did not demosntrate a large territory infarct, I obtained CTA to ensure that he does not have significant atheroscerosis of any of his major arteries or an LVO. CTA was negative for LVO.  Diagnosis: - LUE weakness - hypotension  Recommendations  - Recommend MRI Brain without contrast stroke protocol.(I ordered this and also discussed with cardiac PA, no cardiac contraindications  for this) - can consider compression stockings and abdominal binder for hypotension. - On patient's request, I spoke to his wife and updated her. ______________________________________________________________________   Thank you for the opportunity to take part in the care of this patient. If you have any further questions, please contact the  neurology consultation attending.  Signed,  Erick BlinksSalman Caedence Snowden [facility] Pager Number [phone]

## 2019-10-16 ENCOUNTER — Telehealth: Payer: Self-pay | Admitting: Physician Assistant

## 2019-10-16 ENCOUNTER — Other Ambulatory Visit (HOSPITAL_COMMUNITY): Payer: Self-pay | Admitting: Cardiology

## 2019-10-16 DIAGNOSIS — I2111 ST elevation (STEMI) myocardial infarction involving right coronary artery: Secondary | ICD-10-CM | POA: Diagnosis not present

## 2019-10-16 LAB — CBC
HCT: 40.9 % (ref 39.0–52.0)
Hemoglobin: 13.9 g/dL (ref 13.0–17.0)
MCH: 31.9 pg (ref 26.0–34.0)
MCHC: 34 g/dL (ref 30.0–36.0)
MCV: 93.8 fL (ref 80.0–100.0)
Platelets: 172 10*3/uL (ref 150–400)
RBC: 4.36 MIL/uL (ref 4.22–5.81)
RDW: 11.6 % (ref 11.5–15.5)
WBC: 12.4 10*3/uL — ABNORMAL HIGH (ref 4.0–10.5)
nRBC: 0 % (ref 0.0–0.2)

## 2019-10-16 LAB — HEMOGLOBIN A1C
Hgb A1c MFr Bld: 7.3 % — ABNORMAL HIGH (ref 4.8–5.6)
Mean Plasma Glucose: 163 mg/dL

## 2019-10-16 LAB — GLUCOSE, CAPILLARY: Glucose-Capillary: 141 mg/dL — ABNORMAL HIGH (ref 70–99)

## 2019-10-16 MED ORDER — NITROGLYCERIN 0.4 MG SL SUBL: 0.4000 mg | SUBLINGUAL_TABLET | SUBLINGUAL | 12 refills | Status: AC | PRN

## 2019-10-16 MED ORDER — NITROGLYCERIN 0.4 MG SL SUBL: 0.4000 mg | SUBLINGUAL_TABLET | SUBLINGUAL | 12 refills | Status: DC | PRN

## 2019-10-16 MED ORDER — MIDODRINE HCL 2.5 MG PO TABS
2.5000 mg | ORAL_TABLET | Freq: Three times a day (TID) | ORAL | 3 refills | Status: DC
Start: 2019-10-16 — End: 2019-10-16

## 2019-10-16 MED ORDER — ATORVASTATIN CALCIUM 80 MG PO TABS: 80.0000 mg | ORAL_TABLET | Freq: Every day | ORAL | 6 refills | Status: DC

## 2019-10-16 MED ORDER — ATORVASTATIN CALCIUM 80 MG PO TABS
80.0000 mg | ORAL_TABLET | Freq: Every day | ORAL | 6 refills | Status: DC
Start: 2019-10-16 — End: 2019-10-16

## 2019-10-16 MED ORDER — TICAGRELOR 90 MG PO TABS: 90.0000 mg | ORAL_TABLET | Freq: Two times a day (BID) | ORAL | 11 refills | Status: DC

## 2019-10-16 MED ORDER — ATORVASTATIN CALCIUM 80 MG PO TABS
80.0000 mg | ORAL_TABLET | Freq: Every day | ORAL | 6 refills | Status: DC
Start: 2019-10-16 — End: 2019-10-26

## 2019-10-16 MED ORDER — MIDODRINE HCL 2.5 MG PO TABS: 2.5000 mg | ORAL_TABLET | Freq: Three times a day (TID) | ORAL | 3 refills | Status: DC

## 2019-10-16 MED ORDER — ADVOCATE BLOOD GLUCOSE MONITOR DEVI
1.0000 | Freq: Every morning | 0 refills | Status: DC
Start: 2019-10-16 — End: 2019-10-16

## 2019-10-16 MED ORDER — ADVOCATE BLOOD GLUCOSE MONITOR DEVI: 1.0000 | Freq: Every morning | 0 refills | Status: DC

## 2019-10-16 MED ORDER — TICAGRELOR 90 MG PO TABS
90.0000 mg | ORAL_TABLET | Freq: Two times a day (BID) | ORAL | 11 refills | Status: DC
Start: 2019-10-16 — End: 2019-10-16

## 2019-10-16 MED ORDER — ADVOCATE BLOOD GLUCOSE MONITOR DEVI: 1.0000 | Freq: Every morning | 0 refills | Status: AC

## 2019-10-16 MED FILL — ACCU-CHEK GUIDE W/DEVICE KI: W/DEVICE | 1 days supply | Qty: 1 | Fill #0

## 2019-10-16 MED FILL — BRILINTA 90 MG TABLET: 90 | 30 days supply | Qty: 60 | Fill #0

## 2019-10-16 MED FILL — MIDODRINE HCL 2.5 MG TABLET: 2.5 | 30 days supply | Qty: 90 | Fill #0

## 2019-10-16 MED FILL — NITROGLYCERIN 0.4 MG TAB SL: 0.4 | 8 days supply | Qty: 25 | Fill #0

## 2019-10-16 MED FILL — ACCU-CHEK GUIDE TEST STRIP: 30 days supply | Qty: 100 | Fill #0

## 2019-10-16 MED FILL — ATORVASTATIN CALCIUM 80 MG: 80 | 30 days supply | Qty: 30 | Fill #0

## 2019-10-16 MED FILL — ACCU-CHEK SOFTCLIX LANCETS: 30 days supply | Qty: 100 | Fill #0

## 2019-10-16 NOTE — Discharge Instructions (Signed)
Information about your medication: Brilinta (anti-platelet agent)  Generic Name (Brand): ticagrelor (Brilinta), twice daily medication  PURPOSE: You are taking this medication along with aspirin to lower your chance of having a heart attack, stroke, or blood clots in your heart stent. These can be fatal. Brilinta and aspirin help prevent platelets from sticking together and forming a clot that can block an artery or your stent.   Common SIDE EFFECTS you may experience include: bruising or bleeding more easily, shortness of breath  Do not stop taking BRILINTA without talking to the doctor who prescribes it for you. People who are treated with a stent and stop taking Brilinta too soon, have a higher risk of getting a blood clot in the stent, having a heart attack, or dying. If you stop Brilinta because of bleeding, or for other reasons, your risk of a heart attack or stroke may increase.   Avoid taking NSAID agents or anti-inflammatory medications such as ibuprofen, naproxen given increased bleed risk with plavix - can use acetaminophen (Tylenol) if needed for pain.  Tell all of your doctors and dentists that you are taking Brilinta. They should talk to the doctor who prescribed Brilinta for you before you have any surgery or invasive procedure.   Contact your health care provider if you experience: severe or uncontrollable bleeding, pink/red/brown urine, vomiting blood or vomit that looks like "coffee grounds", red or black stools (looks like tar), coughing up blood or blood clots ---------------------------------------------------------------------------------------------------------------------- Information about your medication: Statin (cholesterol-lowering agent)  Generic Name (Brand): atorvastatin (Lipitor)  PURPOSE: You are taking this medication to lower your "bad" cholesterol (LDL) and to prevent heart attacks and strokes. Statins can also raise your "good" cholesterol (HDL).  Common  SIDE EFFECTS you may experience include: muscle pain or weakness (especially in the legs) and upset stomach.  Take your medication exactly as prescribed. Do not eat large amounts of grapefruit or grapefruit juice while taking this medication.  Contact your health care provider if you experience: severe muscle pain that does not improve, dark urine, or yellowing of your skin or eyes. ----------------------------------------------------------------------------------------------------------------------

## 2019-10-16 NOTE — Progress Notes (Signed)
CARDIAC REHAB PHASE I   PRE:  Rate/Rhythm: 73 SR  BP:  Supine:   Sitting: 104/77     SaO2:  MODE:  Ambulation: 470 ft   POST:  Rate/Rhythm: 96 SR  BP:  Supine:   Sitting: 109/91    SaO2:   Pt in bed when arrived. Pt was independent with getting out of bed. Pt ambulated 470 ft independently with no complaints or concerns. Pt tolerated ambulation very well. Pt stated he felt good after walking. Reminded him of importance of taking Brilinta. Pt was receptive. Returned pt seated in bed. [id number]  Norris Cross, MS, CEP 10/16/2019 8:52 AM

## 2019-10-16 NOTE — Care Management (Signed)
10-16-19 1025 New insurance card sent to admitting to place in Epic. Gala Lewandowsky, RN,BSN Case Manager

## 2019-10-16 NOTE — Telephone Encounter (Signed)
Pt is scheduled for a [facility] f/u per [facility] with Azalee Course on 10/28/19 at 11:15 AM.

## 2019-10-16 NOTE — Discharge Summary (Addendum)
Discharge Summary    Patient ID: Steven Mcneil MRN: [id number]; DOB: [birth year]  Admit date: 10/10/2019 Discharge date: 10/16/2019  Primary Care Provider: Daisy Floro, MD  Primary Cardiologist: Peter [place], MD   Discharge Diagnoses    Principal Problem:   STEMI involving right coronary artery [facility]) Active Problems:   Essential hypertension   Family history of heart disease   Type 2 diabetes mellitus without complication, without long-term current use of insulin (HCC)   Coronary artery disease involving native coronary artery of native heart with unstable angina pectoris (HCC)   LUE weakness   Hypotension    Diagnostic Studies/Procedures   CORONARY STENT INTERVENTION 10/12/2019  Intravascular Ultrasound/IVUS  LEFT HEART CATH AND CORONARY ANGIOGRAPHY  Conclusion    Previously placed Dist RCA drug eluting stent is widely patent.  1st Diag lesion is 95% stenosed.  Balloon angioplasty was performed using a BALLOON EMERGE MR [id number].  Post intervention, there is a 10% residual stenosis.  Prox LAD to Mid LAD lesion is 85% stenosed.  A drug-eluting stent was successfully placed using a STENT RESOLUTE ONYX 3.0X38, postdilated to 3.5 mm and final kissing balloon inflation with a 2.25 mm balloon in the diagonal.  Post intervention, there is a 0% residual stenosis.  LV end diastolic pressure is normal.  There is no aortic valve stenosis.  EBU 3.75 from the right radial worked fairly well. Due to his anatomy, a longer than typical left guide is required.  Continue aggressive secondary prevention. Continue dual antiplatelet therapy for 12 months. After 12 months, would consider clopidogrel monotherapy given his aggressive disease.  Echo 10/11/19 1. Left ventricular ejection fraction, by estimation, is 50 to 55%. Left  ventricular ejection fraction by 3D volume is 50 %. The left ventricle has  low normal function. The left ventricle demonstrates global  hypokinesis.  Left ventricular diastolic  parameters are consistent with Grade I diastolic dysfunction (impaired  relaxation).  2. Right ventricular systolic function is normal. The right ventricular  size is normal. Mildly increased right ventricular wall thickness.  3. The mitral valve is grossly normal. Trivial mitral valve  regurgitation.  4. The aortic valve is tricuspid. Aortic valve regurgitation is not  visualized.  5. The inferior vena cava is normal in size with greater than 50%  respiratory variability, suggesting right atrial pressure of 3 mmHg.    Coronary/Graft Acute MI Revascularization 10/10/2019  CORONARY STENT INTERVENTION  LEFT HEART CATH AND CORONARY ANGIOGRAPHY  Conclusion    RV Branch lesion is 75% stenosed.  Dist RCA lesion is 100% stenosed.  1st Mrg lesion is 30% stenosed.  Prox LAD lesion is 85% stenosed.  Mid LAD lesion is 50% stenosed.  1st Diag lesion is 95% stenosed.  RPAV lesion is 95% stenosed.  Post intervention, there is a 0% residual stenosis.  A drug-eluting stent was successfully placed using a STENT RESOLUTE ONYX 2.5X30.  Post intervention, there is a 0% residual stenosis.  A stent was successfully placed.  There is mild left ventricular systolic dysfunction.  LV end diastolic pressure is mildly elevated.  The left ventricular ejection fraction is 45-50% by visual estimate.  1. Severe 2 vessel obstructive CAD. - 100% distal RCA occlusion- this is the culprit - 85% proximal LAD bifurcation stenosis involving the first diagonal which is 95% stenosis. Medina class 1,1,1. 2. Mild LV dysfunction 3. Mildly elevated LVEDP 20 mm Hg 4. Successful PCI of the distal RCA extending into a large PL branch with DES x 1.  Plan:  DAPT for one year. Anticipate return in 2 days for complex bifurcation PCI of the LAD/diagonal. Risk factor modification.     History of Present Illness     Steven Mcneil is a 59 y.o. male with DM,  HTN, HLD admitted 10/10/19 with an acute inferior STEMI.   Hospital Course     Consultants: neurology    1. Inferior STEMI - troponin > 27,000. Cath showed occluded dRCA s/p successful PCI of the distal RCA extending into a large PL branch with DES x 1. Underwent IVUS guided staged PCI of LAD and angioplasty only to 1st diag. Echo with low normal LVEF at 50-55% and grade 1 DD.  - No recurrent chest pain - Continue ASA, Brillinta, and statin  - No BB or ACE due to hypotension - Mild intermittent dyspnea due to side effect of Brilinta which improved with caffeine. Advise to keep monitor symptoms. - Ambulated well with cardiac rehab.   2. Dizziness / LUE weakness - Seems dizziness started on 10/12>> did - Reported LUE 10/13 evening but symptoms started in afternoon. Spoke with neurology on 10/14 who recommended initiated stroke work up. CT of head negative. CTA without significant stenosis to explains his symptoms. MR of brain negative for stroke. His symptoms improved spontaneously.   3. Hypotension  - Persistent despite aggressive hydration and midodrine - Advised abdominal binder and adequate hydration.  - He was not orthostatic  - Keep log of blood pressure   4. HLD - 10/11/2019: Cholesterol 167; HDL 38; LDL Cholesterol 89; Triglycerides 198; VLDL 40  - Continue statin   5. DM  - Hgb A1c 7.3 - Continue home medications   Did the patient have an acute coronary syndrome (MI, NSTEMI, STEMI, etc) this admission?:  Yes                               AHA/ACC Clinical Performance & Quality Measures: 15. Aspirin prescribed? - Yes 16. ADP Receptor Inhibitor (Plavix/Clopidogrel, Brilinta/Ticagrelor or Effient/Prasugrel) prescribed (includes medically managed patients)? - Yes 17. Beta Blocker prescribed? - No - due to persistent hypotension  18. High Intensity Statin (Lipitor 40-80mg  or Crestor 20-40mg ) prescribed? - Yes 19. EF assessed during THIS hospitalization? - Yes 20. For EF  <40%, was ACEI/ARB prescribed? - Not Applicable (EF >/= 40%) 21. For EF <40%, Aldosterone Antagonist (Spironolactone or Eplerenone) prescribed? - Not Applicable (EF >/= 40%) 22. Cardiac Rehab Phase II ordered (including medically managed patients)? - Yes   _____________  Discharge Vitals Blood pressure (!) 85/60, pulse 71, temperature 99.1 F (37.3 C), temperature source Oral, resp. rate 16, height  (1.702 m), weight 81.7 kg, SpO2 96 %.  Filed Weights   10/10/19 1900 10/15/19 0439 10/16/19 0700  Weight: 80.2 kg 76.5 kg 81.7 kg   Physical Exam Constitutional:      Appearance: Normal appearance.  HENT:     Head: Normocephalic and atraumatic.     Nose: Nose normal.  Eyes:     Extraocular Movements: Extraocular movements intact.     Pupils: Pupils are equal, round, and reactive to light.  Cardiovascular:     Rate and Rhythm: Normal rate and regular rhythm.  Pulmonary:     Effort: Pulmonary effort is normal.     Breath sounds: Normal breath sounds.  Abdominal:     General: Abdomen is flat.     Palpations: Abdomen is soft.  Musculoskeletal:  General: Normal range of motion.     Cervical back: Normal range of motion.  Skin:    General: Skin is warm and dry.  Neurological:     General: No focal deficit present.     Mental Status: He is alert and oriented to person, place, and time.  Psychiatric:        Mood and Affect: Mood normal.        Behavior: Behavior normal.    Labs & Radiologic Studies    CBC Recent Labs    10/15/19 0116 10/16/19 0252  WBC 11.3* 12.4*  HGB 14.2 13.9  HCT 41.9 40.9  MCV 91.9 93.8  PLT 152 172   Basic Metabolic Panel Recent Labs    16/10/96 0116  NA 137  K 3.9  CL 104  CO2 21*  GLUCOSE 117*  BUN 10  CREATININE 0.91  CALCIUM 9.0   High Sensitivity Troponin:   Recent Labs  Lab 10/10/19 1515 10/10/19 1625  TROPONINIHS >27,000* >27,000*    Hemoglobin A1C Recent Labs    10/15/19 1122  HGBA1C 7.3*   Fasting Lipid  Panel Recent Labs    10/15/19 1122  LDLDIRECT 76.5   _____________  MR BRAIN WO CONTRAST  Result Date: 10/15/2019 CLINICAL DATA:  Acute onset of dizziness yesterday. Right upper extremity weakness. EXAM: MRI HEAD WITHOUT CONTRAST TECHNIQUE: Multiplanar, multiecho pulse sequences of the brain and surrounding structures were obtained without intravenous contrast. COMPARISON:  CT studies earlier today. FINDINGS: Brain: The brain has a normal appearance without evidence of malformation, atrophy, old or acute small or large vessel infarction, mass lesion, hemorrhage, hydrocephalus or extra-axial collection. Vascular: Major vessels at the base of the brain show flow. Venous sinuses appear patent. Skull and upper cervical spine: Normal. Sinuses/Orbits: Clear/normal. Other: None significant. IMPRESSION: Normal examination. No abnormality seen to explain the presenting symptoms. Electronically Signed   By: Paulina Fusi M.D.   On: 10/15/2019 13:38   CARDIAC CATHETERIZATION  Result Date: 10/12/2019  Previously placed Dist RCA drug eluting stent is widely patent.  1st Diag lesion is 95% stenosed.  Balloon angioplasty was performed using a BALLOON EMERGE MR [id number].  Post intervention, there is a 10% residual stenosis.  Prox LAD to Mid LAD lesion is 85% stenosed.  A drug-eluting stent was successfully placed using a STENT RESOLUTE ONYX 3.0X38, postdilated to 3.5 mm and final kissing balloon inflation with a 2.25 mm balloon in the diagonal.  Post intervention, there is a 0% residual stenosis.  LV end diastolic pressure is normal.  There is no aortic valve stenosis.  EBU 3.75 from the right radial worked fairly well. Due to his anatomy, a longer than typical left guide is required.  Continue aggressive secondary prevention.  Continue dual antiplatelet therapy for 12 months.  After 12 months, would consider clopidogrel monotherapy given his aggressive disease.   CARDIAC CATHETERIZATION  Result Date:  10/10/2019  RV Branch lesion is 75% stenosed.  Dist RCA lesion is 100% stenosed.  1st Mrg lesion is 30% stenosed.  Prox LAD lesion is 85% stenosed.  Mid LAD lesion is 50% stenosed.  1st Diag lesion is 95% stenosed.  RPAV lesion is 95% stenosed.  Post intervention, there is a 0% residual stenosis.  A drug-eluting stent was successfully placed using a STENT RESOLUTE ONYX 2.5X30.  Post intervention, there is a 0% residual stenosis.  A stent was successfully placed.  There is mild left ventricular systolic dysfunction.  LV end diastolic pressure is mildly elevated.  The left ventricular ejection fraction is 45-50% by visual estimate.  1. Severe 2 vessel obstructive CAD.    - 100% distal RCA occlusion- this is the culprit    - 85% proximal LAD bifurcation stenosis involving the first diagonal which is 95% stenosis. Medina class 1,1,1. 2. Mild LV dysfunction 3. Mildly elevated LVEDP 20 mm Hg 4. Successful PCI of the distal RCA extending into a large PL branch with DES x 1. Plan: DAPT for one year. Anticipate return in 2 days for complex bifurcation PCI of the LAD/diagonal. Risk factor modification.   ECHOCARDIOGRAM COMPLETE  Result Date: 10/11/2019    ECHOCARDIOGRAM REPORT   Patient Name:   Steven Mcneil Date of Exam: 10/11/2019 Medical Rec #:  [id number]  Height:       67.0 in Accession #:    [id number] Weight:       176.8 lb Date of Birth:  04-May-1960  BSA:          1.919 m Patient Age:    59 years   BP:           104/73 mmHg Patient Gender: M          HR:           92 bpm. Exam Location:  Inpatient Procedure: 2D Echo Indications:    acute myocardial infarction 410  History:        Patient has no prior history of Echocardiogram examinations.                 Signs/Symptoms:Chest Pain; Risk Factors:Diabetes, Hypertension                 and Family History of Coronary Artery Disease.  Sonographer:    Delcie Roch Referring Phys: [phone] PETER M [place] IMPRESSIONS  1. Left ventricular ejection fraction, by  estimation, is 50 to 55%. Left ventricular ejection fraction by 3D volume is 50 %. The left ventricle has low normal function. The left ventricle demonstrates global hypokinesis. Left ventricular diastolic parameters are consistent with Grade I diastolic dysfunction (impaired relaxation).  2. Right ventricular systolic function is normal. The right ventricular size is normal. Mildly increased right ventricular wall thickness.  3. The mitral valve is grossly normal. Trivial mitral valve regurgitation.  4. The aortic valve is tricuspid. Aortic valve regurgitation is not visualized.  5. The inferior vena cava is normal in size with greater than 50% respiratory variability, suggesting right atrial pressure of 3 mmHg. Comparison(s): No prior Echocardiogram. FINDINGS  Left Ventricle: Left ventricular ejection fraction, by estimation, is 50 to 55%. Left ventricular ejection fraction by 3D volume is 50 %. The left ventricle has low normal function. The left ventricle demonstrates global hypokinesis. The left ventricular internal cavity size was normal in size. There is no left ventricular hypertrophy. Left ventricular diastolic parameters are consistent with Grade I diastolic dysfunction (impaired relaxation). Right Ventricle: The right ventricular size is normal. Mildly increased right ventricular wall thickness. Right ventricular systolic function is normal. Left Atrium: Left atrial size was normal in size. Right Atrium: Right atrial size was normal in size. Pericardium: There is no evidence of pericardial effusion. Mitral Valve: The mitral valve is grossly normal. Trivial mitral valve regurgitation. Tricuspid Valve: The tricuspid valve is grossly normal. Tricuspid valve regurgitation is not demonstrated. Aortic Valve: The aortic valve is tricuspid. Aortic valve regurgitation is not visualized. Pulmonic Valve: The pulmonic valve was normal in structure. Pulmonic valve regurgitation is not visualized. Aorta: The aortic  root and  ascending aorta are structurally normal, with no evidence of dilitation. Venous: The inferior vena cava is normal in size with greater than 50% respiratory variability, suggesting right atrial pressure of 3 mmHg. IAS/Shunts: The atrial septum is grossly normal.  LEFT VENTRICLE PLAX 2D LVIDd:         4.70 cm         Diastology LVIDs:         3.30 cm         LV e' medial:  7.18 cm/s LV PW:         1.00 cm         LV e' lateral: 7.07 cm/s LV IVS:        0.90 cm LVOT diam:     2.20 cm LV SV:         58              3D Volume EF LV SV Index:   30              LV 3D EF:    Left LVOT Area:     3.80 cm                     ventricular                                             ejection                                             fraction by                                             3D volume                                             is 50 %.                                 3D Volume EF:                                3D EF:        50 % RIGHT VENTRICLE             IVC RV S prime:     12.60 cm/s  IVC diam: 1.80 cm TAPSE (M-mode): 1.5 cm LEFT ATRIUM           Index       RIGHT ATRIUM           Index LA diam:      3.70 cm 1.93 cm/m  RA Area:     11.50 cm LA Vol (A4C): 41.5 ml 21.63 ml/m RA Volume:   23.30 ml  12.14 ml/m  AORTIC VALVE LVOT Vmax:   93.30 cm/s LVOT Vmean:  59.900 cm/s LVOT VTI:    0.153 m  AORTA  Ao Root diam: 3.60 cm Ao Asc diam:  3.50 cm  SHUNTS Systemic VTI:  0.15 m Systemic Diam: 2.20 cm Riley LamMahesh Chandrasekhar MD Electronically signed by Riley LamMahesh Chandrasekhar MD Signature Date/Time: 10/11/2019/1:16:45 PM    Final    CT HEAD CODE STROKE WO CONTRAST  Result Date: 10/15/2019 CLINICAL DATA:  Code stroke. Neuro deficit, acute stroke suspected. Right upper extremity weakness and pain. EXAM: CT HEAD WITHOUT CONTRAST TECHNIQUE: Contiguous axial images were obtained from the base of the skull through the vertex without intravenous contrast. COMPARISON:  CT head and MRI from 09/12/2010 FINDINGS:  Brain: No evidence of acute large vascular territory infarction, hemorrhage, hydrocephalus, extra-axial collection or mass lesion/mass effect. Vascular: No hyperdense vessel or unexpected calcification.Intracranial atherosclerosis.w Skull: Normal. Negative for fracture or focal lesion. Sinuses/Orbits: Minimal ethmoid air cell mucosal thickening. Otherwise, the sinuses are clear. No acute orbital abnormality. Other: No mastoid effusions. ASPECTS [facility]([place] Stroke Program Early CT Score) total score (0-10 with 10 being normal): 10 IMPRESSION: 1. No evidence of acute intracranial abnormality. 2. ASPECTS is 10 Code stroke imaging results were communicated on 10/15/2019 at 8:43 am to provider Dr. Marchelle FolksSal Khaliqdina via telephone, who verbally acknowledged these results. Electronically Signed   By: Feliberto HartsFrederick S Jones MD   On: 10/15/2019 08:46   CT ANGIO HEAD CODE STROKE  Result Date: 10/15/2019 CLINICAL DATA:  Stroke/TIA.  Right upper extremity pain/weakness. EXAM: CT ANGIOGRAPHY HEAD AND NECK TECHNIQUE: Multidetector CT imaging of the head and neck was performed using the standard protocol during bolus administration of intravenous contrast. Multiplanar CT image reconstructions and MIPs were obtained to evaluate the vascular anatomy. Carotid stenosis measurements (when applicable) are obtained utilizing NASCET criteria, using the distal internal carotid diameter as the denominator. CONTRAST:  80mL OMNIPAQUE IOHEXOL 350 MG/ML SOLN COMPARISON:  None. FINDINGS: CTA NECK FINDINGS Aortic arch: Imaged portion shows no evidence of aneurysm or dissection. No significant stenosis of the major arch vessel origins. Simple fluid density around the ascending aorta is partially imaged but likely represents a generous superior pericardial recess. Right carotid system: No evidence of dissection, stenosis (50% or greater) or occlusion. Left carotid system: No evidence of dissection, stenosis (50% or greater) or occlusion. Vertebral  arteries: Right dominant. No evidence of dissection, stenosis (50% or greater) or occlusion. Skeleton: Multilevel degenerative changes of the cervical spine without evidence of acute abnormality. Degenerate disease greatest on the left at C6-C7. Other neck: No evidence of adenopathy or mass. Upper chest: No acute abnormality.  No consolidation. Review of the MIP images confirms the above findings CTA HEAD FINDINGS Anterior circulation: No significant stenosis, proximal occlusion, aneurysm, or vascular malformation. Calcific atherosclerosis of bilateral cavernous carotids. Posterior circulation: Diminutive non dominant left V4 vertebral artery with large left PICA contribution. Mild atherosclerosis of the right V4 vertebral artery. Basilar and bilateral posterior cerebral arteries are patent without evidence of greater than 50% narrowing or proximal occlusion. Small posterior communicating arteries bilaterally. No aneurysm. Venous sinuses: As permitted by contrast timing, patent. Anatomic variants: Small posterior communicating arteries bilaterally. Review of the MIP images confirms the above findings IMPRESSION: No evidence of significant (greater than 50%) arterial stenosis or proximal occlusion in the head or neck. Code stroke imaging results were communicated on 10/15/2019 at 9:13 am to provider Dr. Ezzie DuralSal via telephone, who verbally acknowledged these results. Electronically Signed   By: Feliberto HartsFrederick S Jones MD   On: 10/15/2019 09:29   CT ANGIO NECK CODE STROKE  Result Date: 10/15/2019 CLINICAL DATA:  Stroke/TIA.  Right upper  extremity pain/weakness. EXAM: CT ANGIOGRAPHY HEAD AND NECK TECHNIQUE: Multidetector CT imaging of the head and neck was performed using the standard protocol during bolus administration of intravenous contrast. Multiplanar CT image reconstructions and MIPs were obtained to evaluate the vascular anatomy. Carotid stenosis measurements (when applicable) are obtained utilizing NASCET criteria,  using the distal internal carotid diameter as the denominator. CONTRAST:  22mL OMNIPAQUE IOHEXOL 350 MG/ML SOLN COMPARISON:  None. FINDINGS: CTA NECK FINDINGS Aortic arch: Imaged portion shows no evidence of aneurysm or dissection. No significant stenosis of the major arch vessel origins. Simple fluid density around the ascending aorta is partially imaged but likely represents a generous superior pericardial recess. Right carotid system: No evidence of dissection, stenosis (50% or greater) or occlusion. Left carotid system: No evidence of dissection, stenosis (50% or greater) or occlusion. Vertebral arteries: Right dominant. No evidence of dissection, stenosis (50% or greater) or occlusion. Skeleton: Multilevel degenerative changes of the cervical spine without evidence of acute abnormality. Degenerate disease greatest on the left at C6-C7. Other neck: No evidence of adenopathy or mass. Upper chest: No acute abnormality.  No consolidation. Review of the MIP images confirms the above findings CTA HEAD FINDINGS Anterior circulation: No significant stenosis, proximal occlusion, aneurysm, or vascular malformation. Calcific atherosclerosis of bilateral cavernous carotids. Posterior circulation: Diminutive non dominant left V4 vertebral artery with large left PICA contribution. Mild atherosclerosis of the right V4 vertebral artery. Basilar and bilateral posterior cerebral arteries are patent without evidence of greater than 50% narrowing or proximal occlusion. Small posterior communicating arteries bilaterally. No aneurysm. Venous sinuses: As permitted by contrast timing, patent. Anatomic variants: Small posterior communicating arteries bilaterally. Review of the MIP images confirms the above findings IMPRESSION: No evidence of significant (greater than 50%) arterial stenosis or proximal occlusion in the head or neck. Code stroke imaging results were communicated on 10/15/2019 at 9:13 am to provider Dr. Ezzie Dural via telephone,  who verbally acknowledged these results. Electronically Signed   By: Feliberto Harts MD   On: 10/15/2019 09:29   Disposition   Pt is being discharged home today in good condition.  Follow-up Plans & Appointments     Follow-up Information    Daisy Floro, MD. Schedule an appointment as soon as possible for a visit.   Specialty: Family Medicine Why: Please make an appointment with Dr. Tenny Craw for a hospital follow up in the next 5-7 days after your discharge home from the hospital. Contact information: [address] [place] [address] [phone]        Azalee Course, [place]. Go on 10/28/2019.   Specialties: Cardiology, Radiology Why: @11 :15am for hospital follow up. Please arrive 5 minutes early  Contact information: [address] [place] [phone]              Discharge Instructions    AMB Referral to Cardiac Rehabilitation - Phase II   Complete by: As directed    Diagnosis:  STEMI Coronary Stents     After initial evaluation and assessments completed: Virtual Based Care may be provided alone or in conjunction with Phase 2 Cardiac Rehab based on patient barriers.: Yes   Amb Referral to Cardiac Rehabilitation   Complete by: As directed    Diagnosis:  Coronary Stents STEMI     After initial evaluation and assessments completed: Virtual Based Care may be provided alone or in conjunction with Phase 2 Cardiac Rehab based on patient barriers.: Yes   Diet - low sodium heart healthy  Complete by: As directed    Discharge instructions   Complete by: As directed    No driving for 2 weeks. No lifting over 10 lbs for 4 weeks. No sexual activity for 4 weeks. You may not return to work until cleared by your cardiologist. Keep procedure site clean & dry. If you notice increased pain, swelling, bleeding or pus, call/return!  You may shower, but no soaking baths/hot tubs/pools for 1 week.   Increase activity slowly   Complete by: As  directed       Discharge Medications   Allergies as of 10/16/2019   No Known Allergies     Medication List    STOP taking these medications   lisinopril 5 MG tablet Commonly known as: ZESTRIL     TAKE these medications   aspirin 81 MG tablet Take 81 mg by mouth daily.   atorvastatin 80 MG tablet Commonly known as: LIPITOR Take 1 tablet (80 mg total) by mouth daily.   metFORMIN 500 MG 24 hr tablet Commonly known as: GLUCOPHAGE-XR Take 500 mg by mouth every evening.   midodrine 2.5 MG tablet Commonly known as: PROAMATINE Take 1 tablet (2.5 mg total) by mouth 3 (three) times daily with meals.   nitroGLYCERIN 0.4 MG SL tablet Commonly known as: NITROSTAT Place 1 tablet (0.4 mg total) under the tongue every 5 (five) minutes x 3 doses as needed for chest pain.   omeprazole 20 MG capsule Commonly known as: PRILOSEC Take 20 mg by mouth daily.   ticagrelor 90 MG Tabs tablet Commonly known as: BRILINTA Take 1 tablet (90 mg total) by mouth 2 (two) times daily.          Outstanding Labs/Studies   Lipid panel and LFTS in 6-8 weeks  Duration of Discharge Encounter   Greater than 30 minutes including physician time.  SignedSharrell Ku [place] [place], PA 10/16/2019, 8:08 AM   I have personally seen and examined this patient. I agree with the assessment and plan as outlined above.  Doing better this am. NO dizziness. MRI brain without stroke. Discharge home on above medications.   Verne Carrow 10/16/2019 8:35 AM

## 2019-10-20 ENCOUNTER — Telehealth: Payer: Self-pay | Admitting: Cardiovascular Disease

## 2019-10-20 DIAGNOSIS — Z789 Other specified health status: Secondary | ICD-10-CM | POA: Diagnosis not present

## 2019-10-20 DIAGNOSIS — E1169 Type 2 diabetes mellitus with other specified complication: Secondary | ICD-10-CM | POA: Diagnosis not present

## 2019-10-20 DIAGNOSIS — I959 Hypotension, unspecified: Secondary | ICD-10-CM | POA: Diagnosis not present

## 2019-10-20 DIAGNOSIS — I2111 ST elevation (STEMI) myocardial infarction involving right coronary artery: Secondary | ICD-10-CM | POA: Diagnosis not present

## 2019-10-20 NOTE — Telephone Encounter (Signed)
Called and spoke with Dr. Barbaraann Barthel office. Adv that Mr. Byington is a patient of Dr. Elvis Coil and Dr. Clifton James rounded on him during his hospitalization.  They will reach out to Dr. Swaziland with their questions, which she said has to do with the patient returning to work.  Offered to assist but Dr. Barbaraann Barthel wants to speak with physician.

## 2019-10-20 NOTE — Telephone Encounter (Signed)
Tissinae from [facility] states Dr. Benetta Spar Rankins would like to speak with Dr. Clifton James about a medication prescribed for the patient.

## 2019-10-21 NOTE — Telephone Encounter (Signed)
Dr.Jordan spoke to Dr.Rankin.

## 2019-10-21 NOTE — Telephone Encounter (Signed)
Routing to Dr. Swaziland and his RN

## 2019-10-21 NOTE — Telephone Encounter (Signed)
Tissinae from [facility] office calling back in to check on status of having Dr. Swaziland reach out to Dr. Luciana Axe. Today is Dr. Ephriam Knuckles last day in office until 11/02/2019, Namon Cirri states that it is urgent that Dr. Swaziland reach out today.   Please call/advise.   Thank you!

## 2019-10-22 ENCOUNTER — Telehealth: Payer: Self-pay | Admitting: Cardiology

## 2019-10-22 NOTE — Telephone Encounter (Signed)
Pt c/o Shortness Of Breath: STAT if SOB developed within the last 24 hours or pt is noticeably SOB on the phone  1. Are you currently SOB (can you hear that pt is SOB on the phone)? Patient wife called in  2. How long have you been experiencing SOB? In the morning  3. Are you SOB when sitting or when up moving around? When moving   4. Are you currently experiencing any other symptoms? Patient is Dizzy and his right arm is in pain as well. Patient has a apt on Wednesday but would like a sooner apt. Patient is not feeling well.

## 2019-10-22 NOTE — Telephone Encounter (Signed)
Attempted to call patient. The phone picked up and was immediately cut off.   Will try again later.

## 2019-10-23 ENCOUNTER — Telehealth: Payer: Self-pay | Admitting: Cardiology

## 2019-10-23 NOTE — Telephone Encounter (Signed)
Spoke to patient's wife Dr.Jordan's advice given.She stated she would like to change post hospital appointment to this Monday.Post hospital appointment rescheduled to [facility] 10/25 at 9:45 am with Edd Fabian NP.

## 2019-10-23 NOTE — Telephone Encounter (Signed)
Pt c/o BP issue: STAT if pt c/o blurred vision, one-sided weakness or slurred speech  1. What are your last 5 BP readings? 104/76 pulse 95 on 8 am yesterday,11am 103/77 pulse 90, 1 pm 122/77 pulse 95, 109/73 pulse 88, 8 pm 109/72/ pulse 86, today 9 am 102/78 pulse 91, 105/74 pulse 93, right now 101/75 pulse 90  2. Are you having any other symptoms (ex. Dizziness, headache, blurred vision, passed out)? Dizzy a little chest pain  3. What is your BP issue? Patient he is very tired. Patient wife would like for him to be seen today. Please advise.

## 2019-10-23 NOTE — Telephone Encounter (Signed)
BP is actually improved from his recent hospital stay. Should stay on same medication and see Wynema Birch next week. His BP currently is OK  Maidie Streight Swaziland MD, [facility]

## 2019-10-23 NOTE — Telephone Encounter (Signed)
The patient's wife called in with concerns about the patient's blood pressure. She stated that these have been running low and the patient has been really tired. She wants the patient to be seen today but has been advised that there are no appointments available since it is 4 pm.  He has an appointment with Azalee Course, PA next Wednesday but she wants him seen earlier. The patient has not been seen in the office yet. He recently had a cardiac cath on 10/12/19.  The patient is taking Midodrine 2.5 mg three times daily. Blood pressure at 3:30 today was 101/75. This was after the afternoon dose of Midodrine. The wife stated that his blood pressures have been running around this and she is concerned since he is so tired all the time and has occasional shortness of breath.

## 2019-10-23 NOTE — Telephone Encounter (Signed)
Duplicate see other note

## 2019-10-25 NOTE — Progress Notes (Signed)
Cardiology Clinic Note   Patient Name: Steven Mcneil Date of Encounter: 10/26/2019  Primary Care Provider:  Daisy Floro, MD Primary Cardiologist:  Steven Swaziland, MD  Patient Profile    Steven Mcneil 59 year old male presents to the clinic today status post STEMI.  Received angioplasty to RCA, DES to proximal-mid LAD.  Past Medical History    Past Medical History:  Diagnosis Date  . Chest pain   . Coronary artery disease   . Family history of heart disease   . Hypertension    Past Surgical History:  Procedure Laterality Date  . CORONARY STENT INTERVENTION N/A 10/10/2019   Procedure: CORONARY STENT INTERVENTION;  Surgeon: Mcneil, Steven M, MD;  Location: [facility] INVASIVE CV LAB;  Service: Cardiovascular;  Laterality: N/A;  . CORONARY STENT INTERVENTION N/A 10/12/2019   Procedure: CORONARY STENT INTERVENTION;  Surgeon: Steven Crafts, MD;  Location: [facility] INVASIVE CV LAB;  Service: Cardiovascular;  Laterality: N/A;  . CORONARY/GRAFT ACUTE MI REVASCULARIZATION N/A 10/10/2019   Procedure: Coronary/Graft Acute MI Revascularization;  Surgeon: Mcneil, Steven M, MD;  Location: [facility] INVASIVE CV LAB;  Service: Cardiovascular;  Laterality: N/A;  . INTRAVASCULAR ULTRASOUND/IVUS N/A 10/12/2019   Procedure: Intravascular Ultrasound/IVUS;  Surgeon: Steven Crafts, MD;  Location: [facility] INVASIVE CV LAB;  Service: Cardiovascular;  Laterality: N/A;  . LEFT HEART CATH AND CORONARY ANGIOGRAPHY N/A 10/10/2019   Procedure: LEFT HEART CATH AND CORONARY ANGIOGRAPHY;  Surgeon: Mcneil, Steven M, MD;  Location: [facility] INVASIVE CV LAB;  Service: Cardiovascular;  Laterality: N/A;  . LEFT HEART CATH AND CORONARY ANGIOGRAPHY N/A 10/12/2019   Procedure: LEFT HEART CATH AND CORONARY ANGIOGRAPHY;  Surgeon: Steven Crafts, MD;  Location: [facility] INVASIVE CV LAB;  Service: Cardiovascular;  Laterality: N/A;    Allergies  No Known Allergies  History of Present Illness    Mr. Steven Mcneil has a PMH of DM, hypertension, HLD  and was admitted 10/10/2019 with acute inferior STEMI.  He underwent cardiac catheterization, troponins were greater than 27,000.  His LHC showed an occluded distal RCA status post successful PCI of the distal RCA extending into the large PL branch with DES x1.  He underwent successful staged PCI of LAD with angioplasty and DES x1.  He was placed on aspirin Brilinta, and atorvastatin.  He was not started on beta-blocker or ACE due to hypotension.  It was noted to have mild dyspnea related to Brilinta which improved with caffeine.  He presents the clinic today for follow-up evaluation states he feels well.  He has noticed on occasion that his blood pressure is less than 100 systolic.  He indicates on these days that he does feel somewhat fatigued.  He is continue to take his midodrine medication.  He has had a few episodes of chest discomfort that lasted for a short period of time and dissipated on her own.  He has not needed to take any of his nitroglycerin.  He also brings data on his blood sugars which show that have been somewhat elevated in the mornings.  He reports compliance with his Metformin and all of his other medications.  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  He also indicates that he has some extensive bruising on his right forearm.  This seems to be mild and healing well.  I will give him the salty 6 diet sheet, have him continue to increase his physical activity, continue midodrine, he has been instructed  to call the office with reports of low blood pressures and follow-up in 3 months.  Today he denies chest pain, shortness of breath, lower extremity edema, fatigue, palpitations, melena, hematuria, hemoptysis, diaphoresis, weakness, presyncope, syncope, orthopnea, and PND.   Home Medications    Prior to Admission medications   Medication Sig Start Date End Date Taking? Authorizing Provider  aspirin 81 MG tablet Take 81 mg by  mouth daily.    [provider]  atorvastatin (LIPITOR) 80 MG tablet Take 1 tablet (80 mg total) by mouth daily. 10/16/19   SwazilandJordan, Steven M, MD  Blood Glucose Monitoring Suppl (ADVOCATE BLOOD GLUCOSE MONITOR) DEVI 1 each by Does not apply route every morning. 10/16/19   SwazilandJordan, Steven M, MD  metFORMIN (GLUCOPHAGE-XR) 500 MG 24 hr tablet Take 500 mg by mouth every evening.  04/22/19   [provider]  midodrine (PROAMATINE) 2.5 MG tablet Take 1 tablet (2.5 mg total) by mouth 3 (three) times daily with meals. 10/16/19   SwazilandJordan, Steven M, MD  nitroGLYCERIN (NITROSTAT) 0.4 MG SL tablet Place 1 tablet (0.4 mg total) under the tongue every 5 (five) minutes x 3 doses as needed for chest pain. 10/16/19   SwazilandJordan, Steven M, MD  omeprazole (PRILOSEC) 20 MG capsule Take 20 mg by mouth daily.    [provider]  ticagrelor (BRILINTA) 90 MG TABS tablet Take 1 tablet (90 mg total) by mouth 2 (two) times daily. 10/16/19   SwazilandJordan, Steven M, MD    Family History    Family History  Problem Relation Age of Onset  . Heart attack Father   . Heart attack Brother    He indicated that his father is alive. He indicated that one of his two brothers is deceased.  Social History    Social History   Socioeconomic History  . Marital status: Married    Spouse name: Not on file  . Number of children: Not on file  . Years of education: Not on file  . Highest education level: Not on file  Occupational History  . Not on file  Tobacco Use  . Smoking status: Never Smoker  . Smokeless tobacco: Never Used  Vaping Use  . Vaping Use: Never used  Substance and Sexual Activity  . Alcohol use: No  . Drug use: Never  . Sexual activity: Not on file  Other Topics Concern  . Not on file  Social History Narrative  . Not on file   Social Determinants of Health   Financial Resource Strain:   . Difficulty of Paying Living Expenses: Not on file  Food Insecurity:   . Worried About Brewing technologistunning Out of  Food in the Last Year: Not on file  . Ran Out of Food in the Last Year: Not on file  Transportation Needs:   . Lack of Transportation (Medical): Not on file  . Lack of Transportation (Non-Medical): Not on file  Physical Activity:   . Days of Exercise per Week: Not on file  . Minutes of Exercise per Session: Not on file  Stress:   . Feeling of Stress : Not on file  Social Connections:   . Frequency of Communication with Friends and Family: Not on file  . Frequency of Social Gatherings with Friends and Family: Not on file  . Attends Religious Services: Not on file  . Active Member of Clubs or Organizations: Not on file  . Attends BankerClub or Organization Meetings: Not on file  . Marital Status: Not on  file  Intimate Partner Violence:   . Fear of Current or Ex-Partner: Not on file  . Emotionally Abused: Not on file  . Physically Abused: Not on file  . Sexually Abused: Not on file     Review of Systems    General:  No chills, fever, night sweats or weight changes.  Cardiovascular:  No chest pain, dyspnea on exertion, edema, orthopnea, palpitations, paroxysmal nocturnal dyspnea. Dermatological: No rash, lesions/masses Respiratory: No cough, dyspnea Urologic: No hematuria, dysuria Abdominal:   No nausea, vomiting, diarrhea, bright red blood per rectum, melena, or hematemesis Neurologic:  No visual changes, wkns, changes in mental status. All other systems reviewed and are otherwise negative except as noted above.  Physical Exam    VS:  BP 100/76 (BP Location: Left Arm, Patient Position: Sitting, Cuff Size: Normal)   Pulse 85   Wt 173 lb (78.5 kg)   BMI 27.10 kg/Mcneil  , BMI Body mass index is 27.1 kg/Mcneil. GEN: Well nourished, well developed, in no acute distress. HEENT: normal. Neck: Supple, no JVD, carotid bruits, or masses. Cardiac: RRR, no murmurs, rubs, or gallops. No clubbing, cyanosis, edema.  Radials/DP/PT 2+ and equal bilaterally.  Respiratory:  Respirations regular and  unlabored, clear to auscultation bilaterally. GI: Soft, nontender, nondistended, BS + x 4. MS: no deformity or atrophy. Skin: warm and dry, no rash. Neuro:  Strength and sensation are intact. Psych: Normal affect.  Accessory Clinical Findings    Recent Labs: 10/10/2019: ALT 55; Magnesium 1.9; TSH 1.221 10/15/2019: BUN 10; Creatinine, Ser 0.91; Potassium 3.9; Sodium 137 10/16/2019: Hemoglobin 13.9; Platelets 172   Recent Lipid Panel    Component Value Date/Time   CHOL 167 10/11/2019 0314   TRIG 198 (H) 10/11/2019 0314   HDL 38 (L) 10/11/2019 0314   CHOLHDL 4.4 10/11/2019 0314   VLDL 40 10/11/2019 0314   LDLCALC 89 10/11/2019 0314   LDLDIRECT 76.5 10/15/2019 1122    ECG personally reviewed by me today-normal sinus rhythm left axis deviation inferior infarct undetermined age 12 bpm   Cardiac catheterization 10/10/2019  RV Branch lesion is 75% stenosed.  Dist RCA lesion is 100% stenosed.  1st Mrg lesion is 30% stenosed.  Prox LAD lesion is 85% stenosed.  Mid LAD lesion is 50% stenosed.  1st Diag lesion is 95% stenosed.  RPAV lesion is 95% stenosed.  Post intervention, there is a 0% residual stenosis.  A drug-eluting stent was successfully placed using a STENT RESOLUTE ONYX 2.5X30.  Post intervention, there is a 0% residual stenosis.  A stent was successfully placed.  There is mild left ventricular systolic dysfunction.  LV end diastolic pressure is mildly elevated.  The left ventricular ejection fraction is 45-50% by visual estimate.   1. Severe 2 vessel obstructive CAD.    - 100% distal RCA occlusion- this is the culprit    - 85% proximal LAD bifurcation stenosis involving the first diagonal which is 95% stenosis. Medina class 1,1,1. 2. Mild LV dysfunction 3. Mildly elevated LVEDP 20 mm Hg 4. Successful PCI of the distal RCA extending into a large PL branch with DES x 1.  Plan: DAPT for one year. Anticipate return in 2 days for complex bifurcation PCI of  the LAD/diagonal. Risk factor modification.   Diagnostic Dominance: Right  Intervention    Cardiac catheterization 10/12/2019   Previously placed Dist RCA drug eluting stent is widely patent.  1st Diag lesion is 95% stenosed.  Balloon angioplasty was performed using a BALLOON EMERGE MR [id number].  Post intervention, there is a 10% residual stenosis.  Prox LAD to Mid LAD lesion is 85% stenosed.  A drug-eluting stent was successfully placed using a STENT RESOLUTE ONYX 3.0X38, postdilated to 3.5 mm and final kissing balloon inflation with a 2.25 mm balloon in the diagonal.  Post intervention, there is a 0% residual stenosis.  LV end diastolic pressure is normal.  There is no aortic valve stenosis.  EBU 3.75 from the right radial worked fairly well. Due to his anatomy, a longer than typical left guide is required.   Continue aggressive secondary prevention.  Continue dual antiplatelet therapy for 12 months.  After 12 months, would consider clopidogrel monotherapy given his aggressive disease.  Diagnostic Dominance: Right  Intervention    Assessment & Plan   1.  STEMI-no chest pain today.  Underwent staged PCI and received DES x1 to his RCA followed by DES to his LAD and angioplasty only to first diagonal.  Echocardiogram showed an LVEF of 50-55% and G1 DD.  Has reported a few episodes of chest discomfort which resolved spontaneously and did not need to take nitroglycerin.  Nitroglycerin administration provided.  Patient and his wife expressed understanding. Continue aspirin, Brilinta, atorvastatin, Heart healthy low-sodium diet-salty 6 given Increase physical activity as tolerated  Hypotension-BP today 100/66.  Well-controlled at home.  Still too low at this time to add BB/ACE. Blood pressure log given Increase physical activity as tolerated  Hyperlipidemia-10/11/2019: Cholesterol 167; HDL 38; LDL Cholesterol 89; Triglycerides 198; VLDL 40 Continue atorvastatin Heart  healthy low-sodium high-fiber diet Increase physical activity as tolerated   Dizziness/weakness-no further episodes of dizziness/weakness.  Underwent evaluation by neurology and CT of his head which was negative.  Underwent MRI of his brain which was negative for stroke.  His symptoms spontaneously improved. Monitored by neurology/PCP  Disposition: Follow-up with Dr. Swaziland in 3 months.  Thomasene Ripple. Braxley Balandran NP-C    10/26/2019, 10:24 AM [facility] [address] Office [phone] Fax [phone]  Notice: This dictation was prepared with Dragon dictation along with smaller phrase technology. Any transcriptional errors that result from this process are unintentional and may not be corrected upon review.

## 2019-10-26 ENCOUNTER — Other Ambulatory Visit: Payer: Self-pay

## 2019-10-26 ENCOUNTER — Telehealth: Payer: Self-pay | Admitting: General Practice

## 2019-10-26 ENCOUNTER — Encounter: Payer: Self-pay | Admitting: General Practice

## 2019-10-26 ENCOUNTER — Ambulatory Visit: Payer: BLUE CROSS/BLUE SHIELD | Admitting: General Practice

## 2019-10-26 VITALS — BP 100/76 | HR 85 | Wt 173.0 lb

## 2019-10-26 DIAGNOSIS — I10 Essential (primary) hypertension: Secondary | ICD-10-CM

## 2019-10-26 DIAGNOSIS — I2102 ST elevation (STEMI) myocardial infarction involving left anterior descending coronary artery: Secondary | ICD-10-CM | POA: Diagnosis not present

## 2019-10-26 DIAGNOSIS — R42 Dizziness and giddiness: Secondary | ICD-10-CM

## 2019-10-26 DIAGNOSIS — E7800 Pure hypercholesterolemia, unspecified: Secondary | ICD-10-CM | POA: Diagnosis not present

## 2019-10-26 DIAGNOSIS — Z0279 Encounter for issue of other medical certificate: Secondary | ICD-10-CM

## 2019-10-26 MED ORDER — MIDODRINE HCL 2.5 MG PO TABS: 2.5000 mg | ORAL_TABLET | Freq: Three times a day (TID) | ORAL | 3 refills | Status: DC

## 2019-10-26 MED ORDER — ATORVASTATIN CALCIUM 80 MG PO TABS: 80.0000 mg | ORAL_TABLET | Freq: Every day | ORAL | 6 refills | Status: DC

## 2019-10-26 MED ORDER — OMEPRAZOLE 20 MG PO CPDR: 20.0000 mg | DELAYED_RELEASE_CAPSULE | Freq: Every day | ORAL | 6 refills | Status: DC

## 2019-10-26 MED ORDER — TICAGRELOR 90 MG PO TABS: 90.0000 mg | ORAL_TABLET | Freq: Two times a day (BID) | ORAL | 11 refills | Status: DC

## 2019-10-26 MED ORDER — OMEPRAZOLE 20 MG PO CPDR: 20.0000 mg | DELAYED_RELEASE_CAPSULE | Freq: Every day | ORAL | 6 refills | Status: AC

## 2019-10-26 NOTE — Patient Instructions (Addendum)
Medication Instructions:  The current medical regimen is effective;  continue present plan and medications as directed. Please refer to the Current Medication list given to you today. *If you need a refill on your cardiac medications before your next appointment, please call your pharmacy*  Lab Work:   Testing/Procedures:  NONE    NONE  Special Instructions PLEASE READ AND FOLLOW SALTY 6-ATTACHED  PLEASE INCREASE PHYSICAL ACTIVITY AS TOLERATED  Follow-Up: Your next appointment:  3 month(s) In Person with Peter Swaziland, MD -OR- IF UNAVAILABLE JESSE CLEAVER, FNP-C  At [facility], you and your health needs are our priority.  As part of our continuing mission to provide you with exceptional heart care, we have created designated Provider Care Teams.  These Care Teams include your primary Cardiologist (physician) and Advanced Practice Providers (APPs -  Physician Assistants and Nurse Practitioners) who all work together to provide you with the care you need, when you need it.  We recommend signing up for the patient portal called "MyChart".  Sign up information is provided on this After Visit Summary.  MyChart is used to connect with patients for Virtual Visits (Telemedicine).  Patients are able to view lab/test results, encounter notes, upcoming appointments, etc.  Non-urgent messages can be sent to your provider as well.   To learn more about what you can do with MyChart, go to [email or web address].

## 2019-10-26 NOTE — Telephone Encounter (Signed)
FMLA  Forms from [employer] received on 10/26/19. Completed patient authorization and billing sheet are attached. Gave a copy of billing sheet to [employer]. Received $29.00 cash payment gave to [place]. Took forms to Rosalia ,Charity fundraiser to give to [employer] for completion. 10/26/19  fsw

## 2019-10-26 NOTE — Addendum Note (Signed)
Addended by: Alyson Ingles on: 10/26/2019 10:55 AM   Modules accepted: Orders

## 2019-10-26 NOTE — Addendum Note (Signed)
Addended by: Alyson Ingles on: 10/26/2019 10:56 AM   Modules accepted: Orders

## 2019-10-27 NOTE — Telephone Encounter (Signed)
FMLA  Forms completed by Verdon Cummins Cleaver,N.P. and faxed to [employer] on 10/27/19. Patient notified forms are ready for pick up , he will come by office and pick up his copy of forms.  10/27/19 fsw

## 2019-10-27 NOTE — Telephone Encounter (Signed)
Patient contacted regarding discharge from [facility] on 10/16/19.  Patient understands to follow up with provider Edd Fabian 10/26/19. Patient understands discharge instructions. Patient understands medications and regiment. Patient understands to bring all medications to this visit.

## 2019-10-28 ENCOUNTER — Ambulatory Visit: Payer: BLUE CROSS/BLUE SHIELD | Admitting: Physician Assistant

## 2019-10-30 ENCOUNTER — Telehealth (HOSPITAL_COMMUNITY): Payer: Self-pay

## 2019-10-30 NOTE — Telephone Encounter (Signed)
Pt insurance is active and benefits verified through [employer] Co-pay 0, DED $500/$0 met, out of pocket unlimted/0 met, co-insurance 70%. no pre-authorization required. Passport, 10/30/2019@3:58pm, REF# [id number] ° °Will contact patient to see if he is interested in the Cardiac Rehab Program. If interested, patient will need to complete follow up appt. Once completed, patient will be contacted for scheduling upon review by the RN Navigator. °

## 2019-11-09 ENCOUNTER — Encounter (HOSPITAL_COMMUNITY): Payer: Self-pay

## 2019-11-09 NOTE — Telephone Encounter (Signed)
Attempted to call patient in regards to Cardiac Rehab - LM on VM Mailed letter 

## 2019-11-24 ENCOUNTER — Telehealth (HOSPITAL_COMMUNITY): Payer: Self-pay

## 2019-11-24 NOTE — Telephone Encounter (Signed)
No response from pt.  Closed referral  

## 2020-01-22 NOTE — Progress Notes (Signed)
Cardiology Office Note   Date:  01/26/2020   ID:  Steven Mcneil, DOB [birth year], MRN [id number]  PCP:  Daisy Floro, MD  Cardiologist:   Kwamaine Cuppett [place], MD   Chief Complaint  Patient presents with  . Coronary Artery Disease      History of Present Illness: Steven Mcneil is a 60 y.o. male who presents for follow up CAD. He has a PMH of DM, hypertension, HLD and was admitted 10/10/2019 with acute inferior STEMI.  He underwent cardiac catheterization, troponins were greater than 27,000.  His LHC showed an occluded distal RCA status post successful PCI of the distal RCA extending into the large PL branch with DES x1.  He underwent successful staged PCI of LAD with angioplasty and DES x1.  He was placed on aspirin Brilinta, and atorvastatin.  He was not started on beta-blocker or ACE due to hypotension.  It was noted to have mild dyspnea related to Brilinta which improved with caffeine.  In December his BP improved and he stopped taking midodrine. Since then his diastolic readings have been a little high. He denies any chest pain or SOB. No palpitations or dizziness. Only feels "tired" in his chest when diastolic reading is high. Notes BS in am 130-140 and normal in pm.    Past Medical History:  Diagnosis Date  . Chest pain   . Coronary artery disease   . Family history of heart disease   . Hypertension     Past Surgical History:  Procedure Laterality Date  . CORONARY STENT INTERVENTION N/A 10/10/2019   Procedure: CORONARY STENT INTERVENTION;  Surgeon: [place], Elektra Wartman M, MD;  Location: [facility] INVASIVE CV LAB;  Service: Cardiovascular;  Laterality: N/A;  . CORONARY STENT INTERVENTION N/A 10/12/2019   Procedure: CORONARY STENT INTERVENTION;  Surgeon: Corky Crafts, MD;  Location: [facility] INVASIVE CV LAB;  Service: Cardiovascular;  Laterality: N/A;  . CORONARY/GRAFT ACUTE MI REVASCULARIZATION N/A 10/10/2019   Procedure: Coronary/Graft Acute MI Revascularization;  Surgeon: [place], Orie Cuttino M,  MD;  Location: [facility] INVASIVE CV LAB;  Service: Cardiovascular;  Laterality: N/A;  . INTRAVASCULAR ULTRASOUND/IVUS N/A 10/12/2019   Procedure: Intravascular Ultrasound/IVUS;  Surgeon: Corky Crafts, MD;  Location: [facility] INVASIVE CV LAB;  Service: Cardiovascular;  Laterality: N/A;  . LEFT HEART CATH AND CORONARY ANGIOGRAPHY N/A 10/10/2019   Procedure: LEFT HEART CATH AND CORONARY ANGIOGRAPHY;  Surgeon: [place], Joenathan Sakuma M, MD;  Location: [facility] INVASIVE CV LAB;  Service: Cardiovascular;  Laterality: N/A;  . LEFT HEART CATH AND CORONARY ANGIOGRAPHY N/A 10/12/2019   Procedure: LEFT HEART CATH AND CORONARY ANGIOGRAPHY;  Surgeon: Corky Crafts, MD;  Location: [facility] INVASIVE CV LAB;  Service: Cardiovascular;  Laterality: N/A;     Current Outpatient Medications  Medication Sig Dispense Refill  . aspirin 81 MG tablet Take 81 mg by mouth daily.    Marland Kitchen atorvastatin (LIPITOR) 80 MG tablet Take 1 tablet (80 mg total) by mouth daily. 30 tablet 6  . Blood Glucose Monitoring Suppl (ADVOCATE BLOOD GLUCOSE MONITOR) DEVI 1 each by Does not apply route every morning. 1 each 0  . metFORMIN (GLUCOPHAGE-XR) 500 MG 24 hr tablet Take 500 mg by mouth every evening.     . metoprolol succinate (TOPROL XL) 25 MG 24 hr tablet Take 1 tablet (25 mg total) by mouth daily. 90 tablet 3  . nitroGLYCERIN (NITROSTAT) 0.4 MG SL tablet Place 1 tablet (0.4 mg total) under the tongue every 5 (five) minutes x 3 doses as needed for chest  pain. 25 tablet 12  . omeprazole (PRILOSEC) 20 MG capsule Take 1 capsule (20 mg total) by mouth daily. 30 capsule 6  . ticagrelor (BRILINTA) 90 MG TABS tablet Take 1 tablet (90 mg total) by mouth 2 (two) times daily. 60 tablet 11   No current facility-administered medications for this visit.    Allergies:   Patient has no known allergies.    Social History:  The patient  reports that he has never smoked. He has never used smokeless tobacco. He reports that he does not drink alcohol and does not use  drugs.   Family History:  The patient's family history includes Heart attack in his brother and father.    ROS:  Please see the history of present illness.   Otherwise, review of systems are positive for none.   All other systems are reviewed and negative.    PHYSICAL EXAM: VS:  BP 128/88   Pulse 80   Ht 5\' 7"  (1.702 m)   Wt 171 lb 3.2 oz (77.7 kg)   SpO2 97%   BMI 26.81 kg/m  , BMI Body mass index is 26.81 kg/m. GEN: Well nourished, well developed, in no acute distress  HEENT: normal  Neck: no JVD, carotid bruits, or masses Cardiac: RRR; no murmurs, rubs, or gallops,no edema  Respiratory:  clear to auscultation bilaterally, normal work of breathing GI: soft, nontender, nondistended, + BS MS: no deformity or atrophy  Skin: warm and dry, no rash Neuro:  Strength and sensation are intact Psych: euthymic mood, full affect   EKG:  EKG is not ordered today. The ekg ordered today demonstrates N/A   Recent Labs: 10/10/2019: ALT 55; Magnesium 1.9; TSH 1.221 10/15/2019: BUN 10; Creatinine, Ser 0.91; Potassium 3.9; Sodium 137 10/16/2019: Hemoglobin 13.9; Platelets 172    Lipid Panel    Component Value Date/Time   CHOL 167 10/11/2019 0314   TRIG 198 (H) 10/11/2019 0314   HDL 38 (L) 10/11/2019 0314   CHOLHDL 4.4 10/11/2019 0314   VLDL 40 10/11/2019 0314   LDLCALC 89 10/11/2019 0314   LDLDIRECT 76.5 10/15/2019 1122      Wt Readings from Last 3 Encounters:  01/26/20 171 lb 3.2 oz (77.7 kg)  10/26/19 173 lb (78.5 kg)  10/16/19 180 lb 1.6 oz (81.7 kg)      Other studies Reviewed: Additional studies/ records that were reviewed today include:   Echo 10/11/19: IMPRESSIONS    1. Left ventricular ejection fraction, by estimation, is 50 to 55%. Left  ventricular ejection fraction by 3D volume is 50 %. The left ventricle has  low normal function. The left ventricle demonstrates global hypokinesis.  Left ventricular diastolic  parameters are consistent with Grade I  diastolic dysfunction (impaired  relaxation).  2. Right ventricular systolic function is normal. The right ventricular  size is normal. Mildly increased right ventricular wall thickness.  3. The mitral valve is grossly normal. Trivial mitral valve  regurgitation.  4. The aortic valve is tricuspid. Aortic valve regurgitation is not  visualized.  5. The inferior vena cava is normal in size with greater than 50%  respiratory variability, suggesting right atrial pressure of 3 mmHg.   Comparison(s): No prior Echocardiogram  Cardiac catheterization 10/10/2019  RV Branch lesion is 75% stenosed.  Dist RCA lesion is 100% stenosed.  1st Mrg lesion is 30% stenosed.  Prox LAD lesion is 85% stenosed.  Mid LAD lesion is 50% stenosed.  1st Diag lesion is 95% stenosed.  RPAV lesion is 95%  stenosed.  Post intervention, there is a 0% residual stenosis.  A drug-eluting stent was successfully placed using a STENT RESOLUTE ONYX 2.5X30.  Post intervention, there is a 0% residual stenosis.  A stent was successfully placed.  There is mild left ventricular systolic dysfunction.  LV end diastolic pressure is mildly elevated.  The left ventricular ejection fraction is 45-50% by visual estimate.  1. Severe 2 vessel obstructive CAD. - 100% distal RCA occlusion- this is the culprit - 85% proximal LAD bifurcation stenosis involving the first diagonal which is 95% stenosis. Medina class 1,1,1. 2. Mild LV dysfunction 3. Mildly elevated LVEDP 20 mm Hg 4. Successful PCI of the distal RCA extending into a large PL branch with DES x 1.  Plan: DAPT for one year. Anticipate return in 2 days for complex bifurcation PCI of the LAD/diagonal. Risk factor modification.   Diagnostic Dominance: Right  Intervention    Cardiac catheterization 10/12/2019   Previously placed Dist RCA drug eluting stent is widely patent.  1st Diag lesion is 95% stenosed.  Balloon angioplasty was  performed using a BALLOON EMERGE MR [id number].  Post intervention, there is a 10% residual stenosis.  Prox LAD to Mid LAD lesion is 85% stenosed.  A drug-eluting stent was successfully placed using a STENT RESOLUTE ONYX 3.0X38, postdilated to 3.5 mm and final kissing balloon inflation with a 2.25 mm balloon in the diagonal.  Post intervention, there is a 0% residual stenosis.  LV end diastolic pressure is normal.  There is no aortic valve stenosis.  EBU 3.75 from the right radial worked fairly well. Due to his anatomy, a longer than typical left guide is required.  Continue aggressive secondary prevention. Continue dual antiplatelet therapy for 12 months. After 12 months, would consider clopidogrel monotherapy given his aggressive disease.  Diagnostic Dominance: Right  Intervention      ASSESSMENT AND PLAN:   1.  CAD s/p inferior STEMI October 2021 s/p  DES x1 to his RCA followed by DES to his LAD and angioplasty only to first diagonal.  Echocardiogram showed an LVEF of 50-55% and G1 DD.  No significant angina.  Continue aspirin, Brilinta, atorvastatin, Heart healthy low-sodium diet-salty  Regular aerobic physical activity  Will add Toprol XL 25 mg daily now that BP improved.   2. Hypotension-now resolved and diastolics actually a little high. Add Toprol XL 25 mg daily  3. Hyperlipidemia-10/11/2019: Cholesterol 167; HDL 38; LDL Cholesterol 89; Triglycerides 198; VLDL 40 Continue high dose atorvastatin Check fasting labs today.  4. DM type 2. On metformin 500 mg daily. Check A1c   Current medicines are reviewed at length with the patient today.  The patient does not have concerns regarding medicines.  The following changes have been made:  See above  Labs/ tests ordered today include:   Orders Placed This Encounter  Procedures  . Basic metabolic panel  . Lipid panel  . Hepatic function panel  . HgB A1c     Disposition:   FU with me in 6  months  Signed, Trayvion Embleton [place], MD  01/26/2020 4:14 PM    [facility] [address] [place], [place], [address] Phone [phone], Fax [phone]

## 2020-01-26 ENCOUNTER — Ambulatory Visit (INDEPENDENT_AMBULATORY_CARE_PROVIDER_SITE_OTHER): Payer: 59 | Admitting: Cardiology

## 2020-01-26 ENCOUNTER — Other Ambulatory Visit: Payer: Self-pay

## 2020-01-26 ENCOUNTER — Encounter: Payer: Self-pay | Admitting: Cardiology

## 2020-01-26 VITALS — BP 128/88 | HR 80 | Ht 67.0 in | Wt 171.2 lb

## 2020-01-26 DIAGNOSIS — I25118 Atherosclerotic heart disease of native coronary artery with other forms of angina pectoris: Secondary | ICD-10-CM | POA: Diagnosis not present

## 2020-01-26 DIAGNOSIS — E7800 Pure hypercholesterolemia, unspecified: Secondary | ICD-10-CM

## 2020-01-26 DIAGNOSIS — E119 Type 2 diabetes mellitus without complications: Secondary | ICD-10-CM | POA: Diagnosis not present

## 2020-01-26 DIAGNOSIS — I10 Essential (primary) hypertension: Secondary | ICD-10-CM | POA: Diagnosis not present

## 2020-01-26 MED ORDER — METOPROLOL SUCCINATE ER 25 MG PO TB24: 25.0000 mg | ORAL_TABLET | Freq: Every day | ORAL | 3 refills | Status: DC

## 2020-01-26 NOTE — Patient Instructions (Signed)
Add Toprol XL 25 mg daily for blood pressure  We will check blood work including sugar and cholesterol  Follow up in 6 months

## 2020-01-27 ENCOUNTER — Telehealth: Payer: Self-pay | Admitting: Cardiology

## 2020-01-27 LAB — HEPATIC FUNCTION PANEL
ALT: 33 IU/L (ref 0–44)
AST: 22 IU/L (ref 0–40)
Albumin: 4.3 g/dL (ref 3.8–4.9)
Alkaline Phosphatase: 76 IU/L (ref 44–121)
Bilirubin Total: 0.4 mg/dL (ref 0.0–1.2)
Bilirubin, Direct: 0.14 mg/dL (ref 0.00–0.40)
Total Protein: 6.5 g/dL (ref 6.0–8.5)

## 2020-01-27 LAB — HEMOGLOBIN A1C
Est. average glucose Bld gHb Est-mCnc: 131 mg/dL
Hgb A1c MFr Bld: 6.2 % — ABNORMAL HIGH (ref 4.8–5.6)

## 2020-01-27 LAB — LIPID PANEL
Chol/HDL Ratio: 2.1 ratio (ref 0.0–5.0)
Cholesterol, Total: 105 mg/dL (ref 100–199)
HDL: 49 mg/dL (ref >39)
LDL Chol Calc (NIH): 39 mg/dL (ref 0–99)
Triglycerides: 84 mg/dL (ref 0–149)
VLDL Cholesterol Cal: 17 mg/dL (ref 5–40)

## 2020-01-27 LAB — BASIC METABOLIC PANEL
BUN/Creatinine Ratio: 15 (ref 9–20)
BUN: 12 mg/dL (ref 6–24)
CO2: 22 mmol/L (ref 20–29)
Calcium: 9.1 mg/dL (ref 8.7–10.2)
Chloride: 102 mmol/L (ref 96–106)
Creatinine, Ser: 0.82 mg/dL (ref 0.76–1.27)
GFR calc Af Amer: 113 mL/min/{1.73_m2} (ref >59)
GFR calc non Af Amer: 98 mL/min/{1.73_m2} (ref >59)
Glucose: 239 mg/dL — ABNORMAL HIGH (ref 65–99)
Potassium: 4 mmol/L (ref 3.5–5.2)
Sodium: 140 mmol/L (ref 134–144)

## 2020-01-27 NOTE — Telephone Encounter (Signed)
New message:     Patient wife calling stating that she need a nurse to call her concering her husband medication.

## 2020-01-27 NOTE — Telephone Encounter (Signed)
Spoke with the patients wife. Advised her that the only change made during yesterdays visit with Dr. [place] is that the patient is start Metoprolol 25 mg daily.  The patient has picked up the medication and will start it as prescribed. Patients wife verbalized understanding and has no other questions at this time.

## 2020-03-11 ENCOUNTER — Other Ambulatory Visit: Payer: Self-pay

## 2020-03-15 ENCOUNTER — Other Ambulatory Visit: Payer: Self-pay | Admitting: General Practice

## 2020-05-31 ENCOUNTER — Other Ambulatory Visit: Payer: Self-pay | Admitting: General Practice

## 2020-06-09 ENCOUNTER — Other Ambulatory Visit: Payer: Self-pay

## 2020-06-09 MED ORDER — TICAGRELOR 90 MG PO TABS: 90.0000 mg | ORAL_TABLET | Freq: Two times a day (BID) | ORAL | 3 refills | Status: DC

## 2020-08-05 ENCOUNTER — Other Ambulatory Visit: Payer: Self-pay

## 2020-08-24 ENCOUNTER — Ambulatory Visit: Payer: 59 | Admitting: Cardiology

## 2020-08-29 ENCOUNTER — Ambulatory Visit: Payer: 59 | Admitting: Medical

## 2020-09-04 ENCOUNTER — Other Ambulatory Visit: Payer: Self-pay | Admitting: General Practice

## 2020-09-07 ENCOUNTER — Other Ambulatory Visit: Payer: Self-pay

## 2020-09-07 MED ORDER — TICAGRELOR 90 MG PO TABS: ORAL_TABLET | ORAL | 5 refills | Status: DC

## 2020-10-03 ENCOUNTER — Other Ambulatory Visit: Payer: Self-pay

## 2020-11-03 ENCOUNTER — Other Ambulatory Visit: Payer: Self-pay

## 2020-12-01 ENCOUNTER — Other Ambulatory Visit: Payer: Self-pay | Admitting: Cardiology

## 2020-12-07 ENCOUNTER — Other Ambulatory Visit: Payer: Self-pay

## 2020-12-07 MED ORDER — TICAGRELOR 90 MG PO TABS: ORAL_TABLET | ORAL | 0 refills | Status: AC

## 2022-09-08 IMAGING — MR MR HEAD W/O CM
9 of 11 series · 37 of 48 positions shown · IV contrast (Yes   MULTIHANCE)
Comparison: CT studies earlier today.

CLINICAL DATA: Acute onset of dizziness yesterday. Right upper
extremity weakness.

EXAM:
MRI HEAD WITHOUT CONTRAST
TECHNIQUE: Multiplanar, multiecho pulse sequences of the brain and surrounding
structures were obtained without intravenous contrast.

[Series 3: DWI · axial · 3.0mm · 1.09mm/px · z∈[-43,+99]mm · 11 of 100 slices shown (1 of 4)]
[im 1/100]
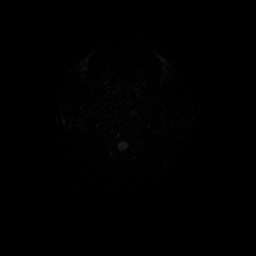
[im 10/100]
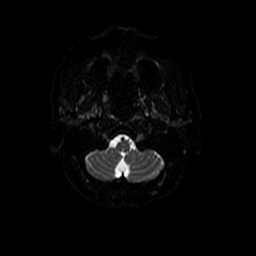
[im 20/100]
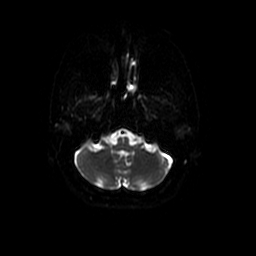
[im 30/100]
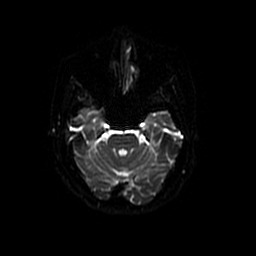
[im 40/100]
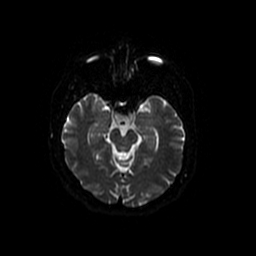
[im 50/100]
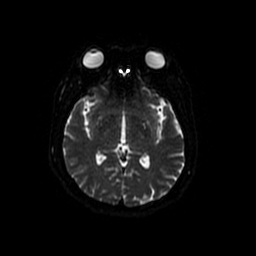
[im 60/100]
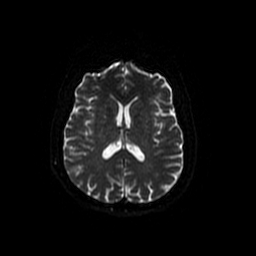
[im 70/100]
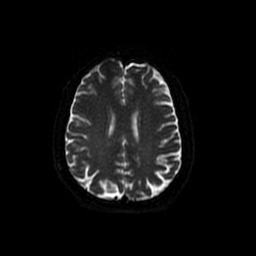
[im 80/100]
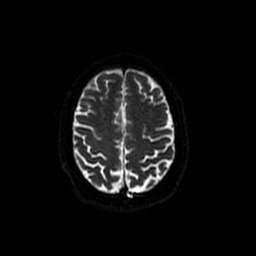
[im 90/100]
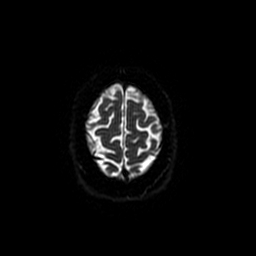
[im 100/100]
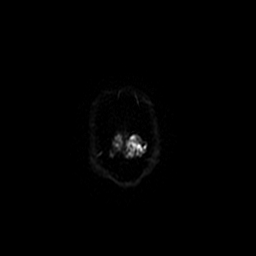

[Series 4: DWI · coronal · 5.0mm · 1.09mm/px · 7 of 68 slices shown (2 of 4)]
[im 1/68]
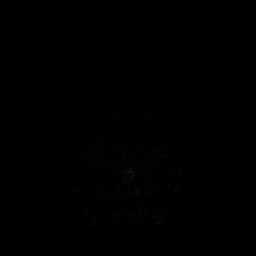
[im 12/68]
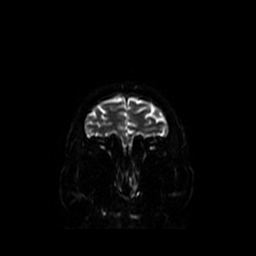
[im 23/68]
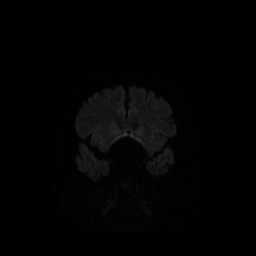
[im 34/68]
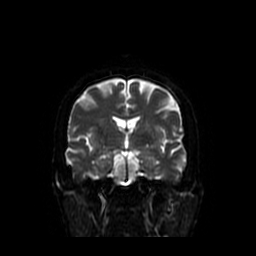
[im 45/68]
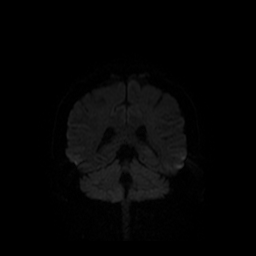
[im 56/68]
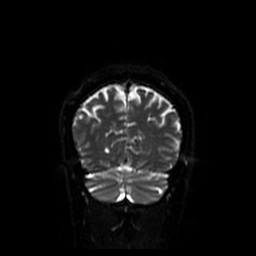
[im 68/68]
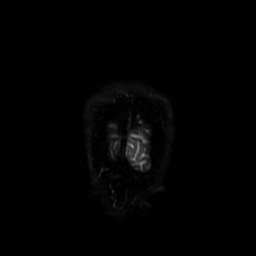

[Series 5: T1 · sagittal · 5.0mm · 0.47mm/px · 2 of 23 slices shown (1 of 2)]
[im 1/23]
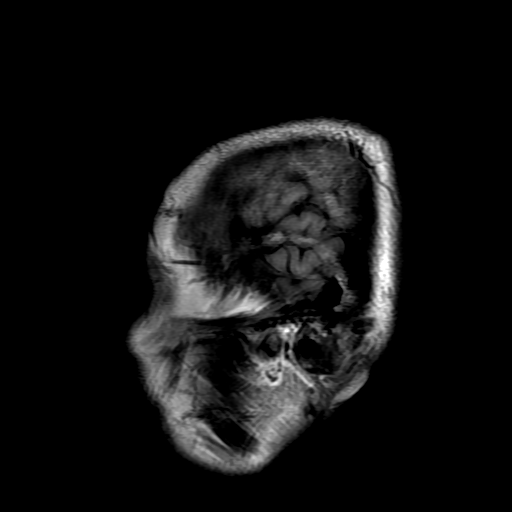
[im 23/23]
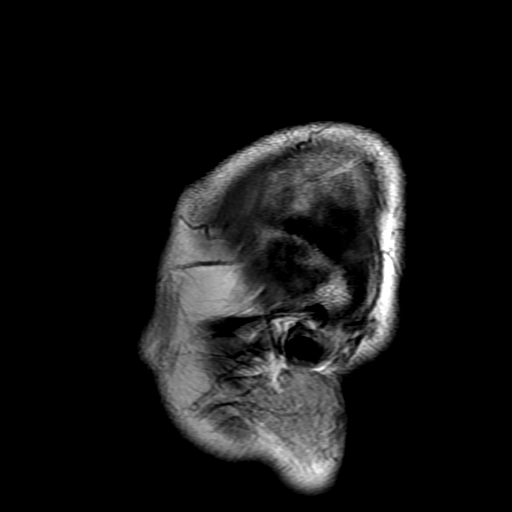

[Series 6: T2 · axial · 5.0mm · 0.43mm/px · z∈[-22,+127]mm · 2 of 26 slices shown]
[im 1/26]
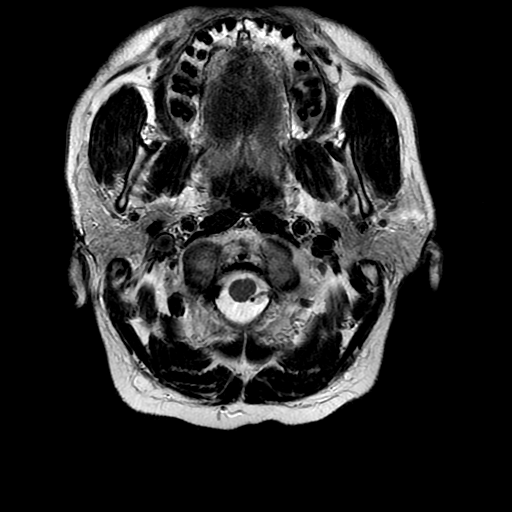
[im 26/26]
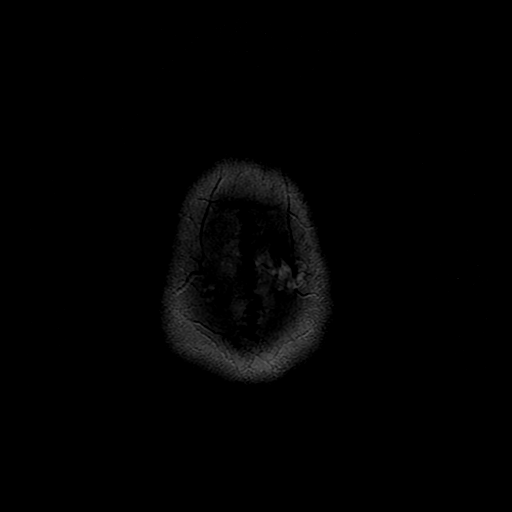

[Series 7: FLAIR · axial · 5.0mm · 0.43mm/px · z∈[-22,+127]mm · 2 of 26 slices shown]
[im 1/26]
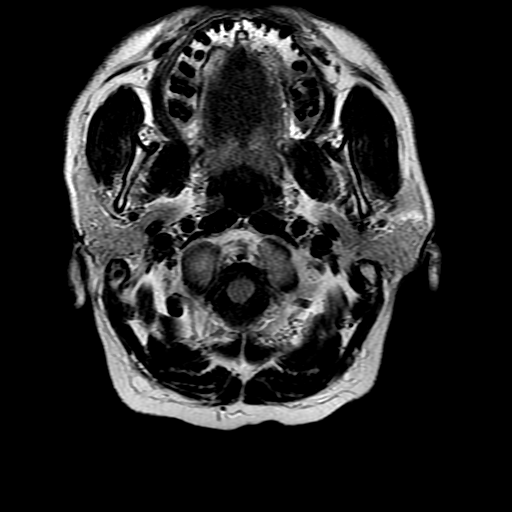
[im 26/26]
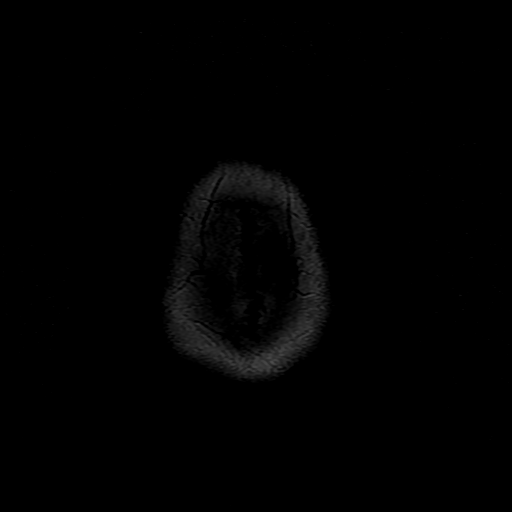

[Series 9: T1 · axial · 3.0mm · 0.47mm/px · z∈[-22,+27]mm · 3 of 100 slices shown (2 of 2)]
[im 1/100]
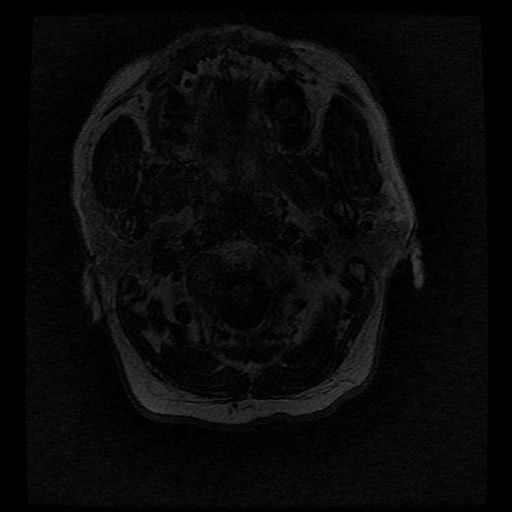
[im 12/100]
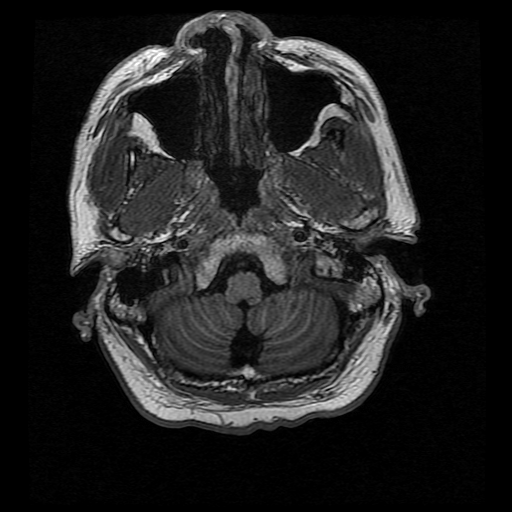
[im 34/100]
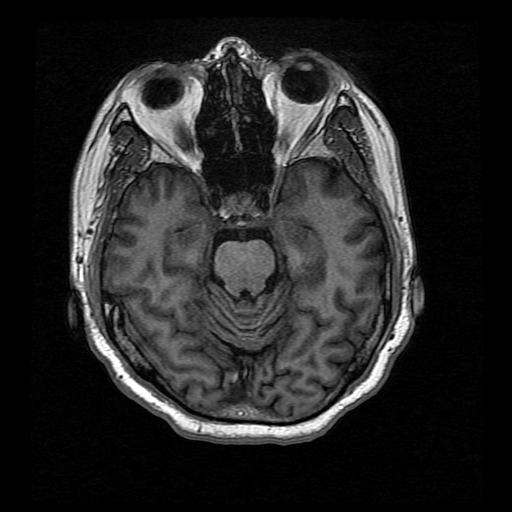

[Series 11: T2 post-contrast · coronal · 5.0mm · 0.39mm/px · 2 of 25 slices shown]
[im 1/25]
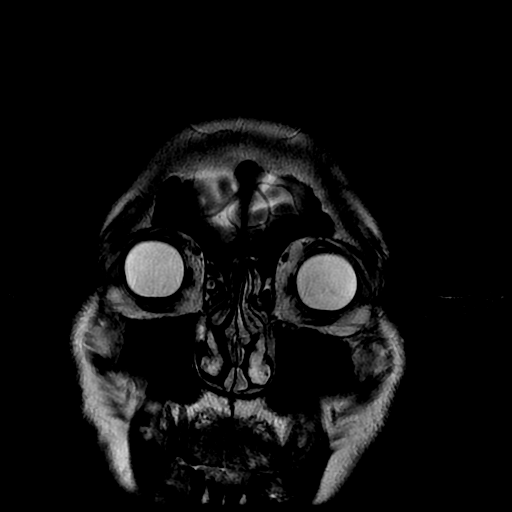
[im 25/25]
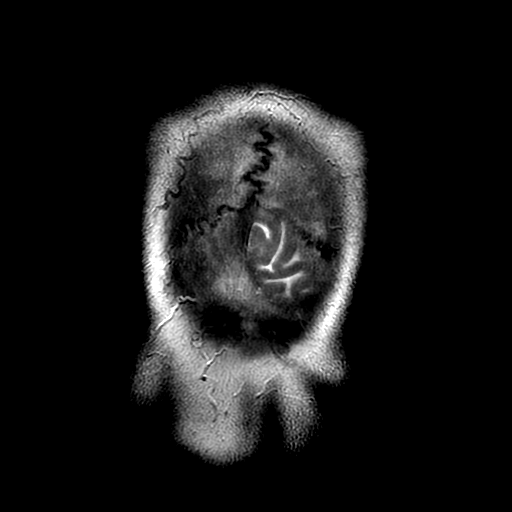

[Series 300: DWI · axial · 3.0mm · 1.09mm/px · z∈[-43,+99]mm · 5 of 50 slices shown (3 of 4)]
[im 1/50]
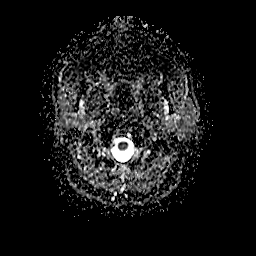
[im 13/50]
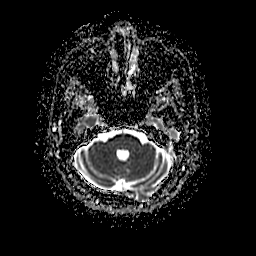
[im 25/50]
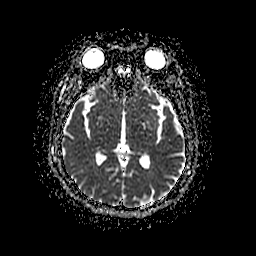
[im 37/50]
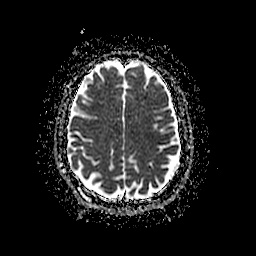
[im 50/50]
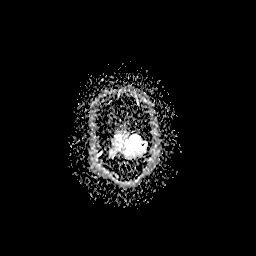

[Series 400: DWI · coronal · 5.0mm · 1.09mm/px · 3 of 34 slices shown (4 of 4)]
[im 1/34]
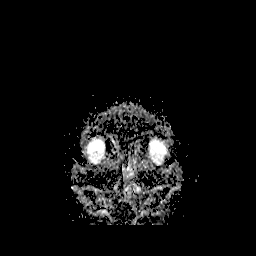
[im 17/34]
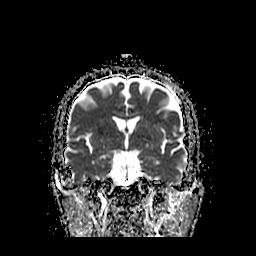
[im 34/34]
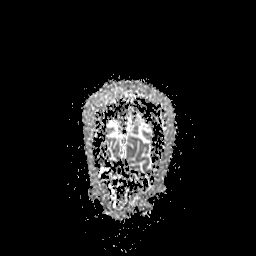

[37 of 48 positions shown; findings below may reference images not displayed]

FINDINGS: Brain: The brain has a normal appearance without evidence of
malformation, atrophy, old or acute small or large vessel
infarction, mass lesion, hemorrhage, hydrocephalus or extra-axial
collection.

Vascular: Major vessels at the base of the brain show flow. Venous
sinuses appear patent.

Skull and upper cervical spine: Normal.

Sinuses/Orbits: Clear/normal.

Other: None significant.
IMPRESSION: Normal examination. No abnormality seen to explain the presenting
symptoms.
